# Patient Record
Sex: Female | Born: 1935 | Race: White | Hispanic: No | Smoking: Former smoker
Health system: Southern US, Community
[De-identification: ages and names within clinical notes are randomized; demographics above are authoritative.]

## PROBLEM LIST (undated history)

## (undated) DIAGNOSIS — J189 Pneumonia, unspecified organism: Secondary | ICD-10-CM

## (undated) DIAGNOSIS — D649 Anemia, unspecified: Secondary | ICD-10-CM

## (undated) DIAGNOSIS — J449 Chronic obstructive pulmonary disease, unspecified: Secondary | ICD-10-CM

## (undated) DIAGNOSIS — G20 Parkinson's disease: Secondary | ICD-10-CM

## (undated) DIAGNOSIS — I4891 Unspecified atrial fibrillation: Secondary | ICD-10-CM

## (undated) DIAGNOSIS — J9610 Chronic respiratory failure, unspecified whether with hypoxia or hypercapnia: Secondary | ICD-10-CM

## (undated) DIAGNOSIS — E039 Hypothyroidism, unspecified: Secondary | ICD-10-CM

## (undated) DIAGNOSIS — I509 Heart failure, unspecified: Secondary | ICD-10-CM

## (undated) DIAGNOSIS — C8590 Non-Hodgkin lymphoma, unspecified, unspecified site: Secondary | ICD-10-CM

## (undated) DIAGNOSIS — I38 Endocarditis, valve unspecified: Secondary | ICD-10-CM

## (undated) DIAGNOSIS — M1990 Unspecified osteoarthritis, unspecified site: Secondary | ICD-10-CM

## (undated) DIAGNOSIS — I10 Essential (primary) hypertension: Secondary | ICD-10-CM

## (undated) DIAGNOSIS — J45909 Unspecified asthma, uncomplicated: Secondary | ICD-10-CM

## (undated) DIAGNOSIS — C801 Malignant (primary) neoplasm, unspecified: Secondary | ICD-10-CM

## (undated) HISTORY — DX: Unspecified osteoarthritis, unspecified site: M19.90

## (undated) HISTORY — DX: Chronic respiratory failure, unspecified whether with hypoxia or hypercapnia: J96.10

## (undated) HISTORY — DX: Essential (primary) hypertension: I10

## (undated) HISTORY — PX: ABDOMINAL HYSTERECTOMY: SHX81

## (undated) HISTORY — PX: CHOLECYSTECTOMY: SHX55

## (undated) HISTORY — DX: Parkinson's disease: G20

## (undated) HISTORY — DX: Chronic obstructive pulmonary disease, unspecified: J44.9

## (undated) HISTORY — DX: Non-Hodgkin lymphoma, unspecified, unspecified site: C85.90

## (undated) HISTORY — DX: Unspecified asthma, uncomplicated: J45.909

## (undated) HISTORY — DX: Malignant (primary) neoplasm, unspecified: C80.1

## (undated) HISTORY — DX: Hypothyroidism, unspecified: E03.9

## (undated) HISTORY — DX: Anemia, unspecified: D64.9

## (undated) HISTORY — DX: Unspecified atrial fibrillation: I48.91

## (undated) HISTORY — DX: Pneumonia, unspecified organism: J18.9

## (undated) HISTORY — DX: Endocarditis, valve unspecified: I38

## (undated) HISTORY — DX: Heart failure, unspecified: I50.9

## (undated) HISTORY — PX: MASTECTOMY: SHX3

## (undated) HISTORY — PX: ORIF ANKLE FRACTURE: SUR919

---

## 2000-06-20 ENCOUNTER — Other Ambulatory Visit: Admission: RE | Admit: 2000-06-20 | Discharge: 2000-06-20 | Payer: Self-pay | Admitting: *Deleted

## 2003-11-25 ENCOUNTER — Ambulatory Visit: Payer: Self-pay | Admitting: Oncology

## 2004-02-03 ENCOUNTER — Ambulatory Visit: Payer: Self-pay | Admitting: Oncology

## 2004-04-27 ENCOUNTER — Ambulatory Visit: Payer: Self-pay | Admitting: Oncology

## 2004-06-15 ENCOUNTER — Ambulatory Visit: Payer: Self-pay | Admitting: Oncology

## 2004-08-16 ENCOUNTER — Ambulatory Visit: Payer: Self-pay | Admitting: Oncology

## 2004-09-22 ENCOUNTER — Ambulatory Visit: Payer: Self-pay | Admitting: Oncology

## 2004-11-24 ENCOUNTER — Ambulatory Visit: Payer: Self-pay | Admitting: Oncology

## 2005-02-23 ENCOUNTER — Ambulatory Visit: Payer: Self-pay | Admitting: Oncology

## 2005-05-18 ENCOUNTER — Ambulatory Visit: Payer: Self-pay | Admitting: Oncology

## 2005-09-07 ENCOUNTER — Ambulatory Visit: Payer: Self-pay | Admitting: Oncology

## 2005-12-28 ENCOUNTER — Ambulatory Visit: Payer: Self-pay | Admitting: Oncology

## 2006-04-19 ENCOUNTER — Ambulatory Visit: Payer: Self-pay | Admitting: Oncology

## 2006-08-09 ENCOUNTER — Ambulatory Visit: Payer: Self-pay | Admitting: Oncology

## 2014-11-09 DIAGNOSIS — Z9981 Dependence on supplemental oxygen: Secondary | ICD-10-CM

## 2014-11-09 DIAGNOSIS — Z853 Personal history of malignant neoplasm of breast: Secondary | ICD-10-CM | POA: Diagnosis not present

## 2014-11-09 DIAGNOSIS — Z8572 Personal history of non-Hodgkin lymphomas: Secondary | ICD-10-CM | POA: Diagnosis not present

## 2014-11-09 DIAGNOSIS — E538 Deficiency of other specified B group vitamins: Secondary | ICD-10-CM | POA: Diagnosis not present

## 2014-11-09 DIAGNOSIS — R634 Abnormal weight loss: Secondary | ICD-10-CM | POA: Diagnosis not present

## 2015-05-25 DIAGNOSIS — F339 Major depressive disorder, recurrent, unspecified: Secondary | ICD-10-CM | POA: Insufficient documentation

## 2015-05-25 DIAGNOSIS — I5032 Chronic diastolic (congestive) heart failure: Secondary | ICD-10-CM | POA: Insufficient documentation

## 2015-05-25 DIAGNOSIS — J449 Chronic obstructive pulmonary disease, unspecified: Secondary | ICD-10-CM | POA: Insufficient documentation

## 2015-05-25 DIAGNOSIS — Z853 Personal history of malignant neoplasm of breast: Secondary | ICD-10-CM | POA: Insufficient documentation

## 2015-05-25 DIAGNOSIS — R251 Tremor, unspecified: Secondary | ICD-10-CM | POA: Insufficient documentation

## 2015-05-25 DIAGNOSIS — I10 Essential (primary) hypertension: Secondary | ICD-10-CM | POA: Insufficient documentation

## 2015-05-25 DIAGNOSIS — E039 Hypothyroidism, unspecified: Secondary | ICD-10-CM | POA: Insufficient documentation

## 2015-05-25 DIAGNOSIS — I482 Chronic atrial fibrillation, unspecified: Secondary | ICD-10-CM | POA: Insufficient documentation

## 2015-05-25 DIAGNOSIS — M810 Age-related osteoporosis without current pathological fracture: Secondary | ICD-10-CM | POA: Insufficient documentation

## 2015-05-25 DIAGNOSIS — M159 Polyosteoarthritis, unspecified: Secondary | ICD-10-CM | POA: Insufficient documentation

## 2015-06-09 DIAGNOSIS — Z8572 Personal history of non-Hodgkin lymphomas: Secondary | ICD-10-CM | POA: Diagnosis not present

## 2016-06-07 DIAGNOSIS — Z853 Personal history of malignant neoplasm of breast: Secondary | ICD-10-CM | POA: Diagnosis not present

## 2016-06-07 DIAGNOSIS — J449 Chronic obstructive pulmonary disease, unspecified: Secondary | ICD-10-CM | POA: Diagnosis not present

## 2016-06-07 DIAGNOSIS — Z8572 Personal history of non-Hodgkin lymphomas: Secondary | ICD-10-CM | POA: Diagnosis not present

## 2016-11-23 DIAGNOSIS — Z9221 Personal history of antineoplastic chemotherapy: Secondary | ICD-10-CM | POA: Diagnosis not present

## 2016-11-23 DIAGNOSIS — Z8572 Personal history of non-Hodgkin lymphomas: Secondary | ICD-10-CM | POA: Diagnosis not present

## 2016-11-23 DIAGNOSIS — E538 Deficiency of other specified B group vitamins: Secondary | ICD-10-CM | POA: Diagnosis not present

## 2016-11-23 DIAGNOSIS — Z853 Personal history of malignant neoplasm of breast: Secondary | ICD-10-CM | POA: Diagnosis not present

## 2016-11-23 DIAGNOSIS — J449 Chronic obstructive pulmonary disease, unspecified: Secondary | ICD-10-CM | POA: Diagnosis not present

## 2016-11-23 DIAGNOSIS — Z9981 Dependence on supplemental oxygen: Secondary | ICD-10-CM | POA: Diagnosis not present

## 2016-11-23 DIAGNOSIS — I509 Heart failure, unspecified: Secondary | ICD-10-CM | POA: Diagnosis not present

## 2016-12-14 DIAGNOSIS — R7303 Prediabetes: Secondary | ICD-10-CM | POA: Insufficient documentation

## 2017-07-09 DIAGNOSIS — R609 Edema, unspecified: Secondary | ICD-10-CM | POA: Insufficient documentation

## 2017-11-23 DIAGNOSIS — Z853 Personal history of malignant neoplasm of breast: Secondary | ICD-10-CM | POA: Diagnosis not present

## 2017-11-23 DIAGNOSIS — Z9221 Personal history of antineoplastic chemotherapy: Secondary | ICD-10-CM

## 2017-11-23 DIAGNOSIS — Z9981 Dependence on supplemental oxygen: Secondary | ICD-10-CM

## 2017-11-23 DIAGNOSIS — Z8572 Personal history of non-Hodgkin lymphomas: Secondary | ICD-10-CM | POA: Diagnosis not present

## 2017-11-23 DIAGNOSIS — J449 Chronic obstructive pulmonary disease, unspecified: Secondary | ICD-10-CM | POA: Diagnosis not present

## 2017-11-23 DIAGNOSIS — I509 Heart failure, unspecified: Secondary | ICD-10-CM

## 2017-11-23 DIAGNOSIS — R0789 Other chest pain: Secondary | ICD-10-CM

## 2018-01-06 DIAGNOSIS — J9621 Acute and chronic respiratory failure with hypoxia: Secondary | ICD-10-CM | POA: Diagnosis not present

## 2018-01-06 DIAGNOSIS — I509 Heart failure, unspecified: Secondary | ICD-10-CM | POA: Diagnosis not present

## 2018-01-07 DIAGNOSIS — I509 Heart failure, unspecified: Secondary | ICD-10-CM | POA: Diagnosis not present

## 2018-01-07 DIAGNOSIS — J9621 Acute and chronic respiratory failure with hypoxia: Secondary | ICD-10-CM | POA: Diagnosis not present

## 2018-01-08 DIAGNOSIS — J9621 Acute and chronic respiratory failure with hypoxia: Secondary | ICD-10-CM | POA: Diagnosis not present

## 2018-01-08 DIAGNOSIS — I509 Heart failure, unspecified: Secondary | ICD-10-CM | POA: Diagnosis not present

## 2018-07-30 DIAGNOSIS — Z8572 Personal history of non-Hodgkin lymphomas: Secondary | ICD-10-CM | POA: Insufficient documentation

## 2018-12-22 DIAGNOSIS — J449 Chronic obstructive pulmonary disease, unspecified: Secondary | ICD-10-CM

## 2018-12-22 DIAGNOSIS — J441 Chronic obstructive pulmonary disease with (acute) exacerbation: Secondary | ICD-10-CM

## 2018-12-22 DIAGNOSIS — I340 Nonrheumatic mitral (valve) insufficiency: Secondary | ICD-10-CM | POA: Diagnosis not present

## 2018-12-22 DIAGNOSIS — I4891 Unspecified atrial fibrillation: Secondary | ICD-10-CM

## 2018-12-22 DIAGNOSIS — I342 Nonrheumatic mitral (valve) stenosis: Secondary | ICD-10-CM

## 2018-12-22 DIAGNOSIS — Z79899 Other long term (current) drug therapy: Secondary | ICD-10-CM

## 2018-12-22 DIAGNOSIS — J90 Pleural effusion, not elsewhere classified: Secondary | ICD-10-CM

## 2018-12-22 DIAGNOSIS — I361 Nonrheumatic tricuspid (valve) insufficiency: Secondary | ICD-10-CM | POA: Diagnosis not present

## 2018-12-22 DIAGNOSIS — I509 Heart failure, unspecified: Secondary | ICD-10-CM | POA: Diagnosis not present

## 2018-12-22 DIAGNOSIS — I350 Nonrheumatic aortic (valve) stenosis: Secondary | ICD-10-CM | POA: Diagnosis not present

## 2018-12-22 DIAGNOSIS — I110 Hypertensive heart disease with heart failure: Secondary | ICD-10-CM | POA: Diagnosis not present

## 2018-12-22 DIAGNOSIS — I161 Hypertensive emergency: Secondary | ICD-10-CM

## 2018-12-22 DIAGNOSIS — J9601 Acute respiratory failure with hypoxia: Secondary | ICD-10-CM

## 2018-12-23 DIAGNOSIS — I350 Nonrheumatic aortic (valve) stenosis: Secondary | ICD-10-CM | POA: Diagnosis not present

## 2018-12-23 DIAGNOSIS — I110 Hypertensive heart disease with heart failure: Secondary | ICD-10-CM | POA: Diagnosis not present

## 2018-12-23 DIAGNOSIS — I161 Hypertensive emergency: Secondary | ICD-10-CM | POA: Diagnosis not present

## 2018-12-23 DIAGNOSIS — J90 Pleural effusion, not elsewhere classified: Secondary | ICD-10-CM | POA: Diagnosis not present

## 2018-12-23 DIAGNOSIS — J441 Chronic obstructive pulmonary disease with (acute) exacerbation: Secondary | ICD-10-CM | POA: Diagnosis not present

## 2018-12-23 DIAGNOSIS — J9601 Acute respiratory failure with hypoxia: Secondary | ICD-10-CM | POA: Diagnosis not present

## 2018-12-23 DIAGNOSIS — I4891 Unspecified atrial fibrillation: Secondary | ICD-10-CM | POA: Diagnosis not present

## 2018-12-23 DIAGNOSIS — I509 Heart failure, unspecified: Secondary | ICD-10-CM | POA: Diagnosis not present

## 2018-12-24 DIAGNOSIS — I4891 Unspecified atrial fibrillation: Secondary | ICD-10-CM | POA: Diagnosis not present

## 2018-12-24 DIAGNOSIS — J90 Pleural effusion, not elsewhere classified: Secondary | ICD-10-CM | POA: Diagnosis not present

## 2018-12-24 DIAGNOSIS — I350 Nonrheumatic aortic (valve) stenosis: Secondary | ICD-10-CM | POA: Diagnosis not present

## 2018-12-24 DIAGNOSIS — I110 Hypertensive heart disease with heart failure: Secondary | ICD-10-CM | POA: Diagnosis not present

## 2018-12-24 DIAGNOSIS — J9601 Acute respiratory failure with hypoxia: Secondary | ICD-10-CM | POA: Diagnosis not present

## 2018-12-24 DIAGNOSIS — I509 Heart failure, unspecified: Secondary | ICD-10-CM | POA: Diagnosis not present

## 2018-12-24 DIAGNOSIS — I161 Hypertensive emergency: Secondary | ICD-10-CM | POA: Diagnosis not present

## 2018-12-24 DIAGNOSIS — J441 Chronic obstructive pulmonary disease with (acute) exacerbation: Secondary | ICD-10-CM | POA: Diagnosis not present

## 2018-12-25 DIAGNOSIS — I350 Nonrheumatic aortic (valve) stenosis: Secondary | ICD-10-CM | POA: Diagnosis not present

## 2018-12-25 DIAGNOSIS — I509 Heart failure, unspecified: Secondary | ICD-10-CM | POA: Diagnosis not present

## 2018-12-25 DIAGNOSIS — J90 Pleural effusion, not elsewhere classified: Secondary | ICD-10-CM | POA: Diagnosis not present

## 2018-12-25 DIAGNOSIS — I110 Hypertensive heart disease with heart failure: Secondary | ICD-10-CM | POA: Diagnosis not present

## 2018-12-25 DIAGNOSIS — J441 Chronic obstructive pulmonary disease with (acute) exacerbation: Secondary | ICD-10-CM | POA: Diagnosis not present

## 2018-12-25 DIAGNOSIS — J9601 Acute respiratory failure with hypoxia: Secondary | ICD-10-CM | POA: Diagnosis not present

## 2018-12-25 DIAGNOSIS — I161 Hypertensive emergency: Secondary | ICD-10-CM | POA: Diagnosis not present

## 2018-12-25 DIAGNOSIS — I4891 Unspecified atrial fibrillation: Secondary | ICD-10-CM | POA: Diagnosis not present

## 2018-12-26 DIAGNOSIS — J9601 Acute respiratory failure with hypoxia: Secondary | ICD-10-CM | POA: Diagnosis not present

## 2018-12-26 DIAGNOSIS — J441 Chronic obstructive pulmonary disease with (acute) exacerbation: Secondary | ICD-10-CM | POA: Diagnosis not present

## 2018-12-26 DIAGNOSIS — I350 Nonrheumatic aortic (valve) stenosis: Secondary | ICD-10-CM | POA: Diagnosis not present

## 2018-12-26 DIAGNOSIS — I110 Hypertensive heart disease with heart failure: Secondary | ICD-10-CM | POA: Diagnosis not present

## 2018-12-26 DIAGNOSIS — I161 Hypertensive emergency: Secondary | ICD-10-CM | POA: Diagnosis not present

## 2018-12-26 DIAGNOSIS — I4891 Unspecified atrial fibrillation: Secondary | ICD-10-CM | POA: Diagnosis not present

## 2018-12-26 DIAGNOSIS — I509 Heart failure, unspecified: Secondary | ICD-10-CM | POA: Diagnosis not present

## 2018-12-26 DIAGNOSIS — J90 Pleural effusion, not elsewhere classified: Secondary | ICD-10-CM | POA: Diagnosis not present

## 2018-12-27 DIAGNOSIS — J441 Chronic obstructive pulmonary disease with (acute) exacerbation: Secondary | ICD-10-CM | POA: Diagnosis not present

## 2018-12-27 DIAGNOSIS — I509 Heart failure, unspecified: Secondary | ICD-10-CM | POA: Diagnosis not present

## 2018-12-27 DIAGNOSIS — I110 Hypertensive heart disease with heart failure: Secondary | ICD-10-CM | POA: Diagnosis not present

## 2018-12-27 DIAGNOSIS — I161 Hypertensive emergency: Secondary | ICD-10-CM | POA: Diagnosis not present

## 2018-12-27 DIAGNOSIS — I4891 Unspecified atrial fibrillation: Secondary | ICD-10-CM | POA: Diagnosis not present

## 2018-12-27 DIAGNOSIS — J90 Pleural effusion, not elsewhere classified: Secondary | ICD-10-CM | POA: Diagnosis not present

## 2018-12-27 DIAGNOSIS — J9601 Acute respiratory failure with hypoxia: Secondary | ICD-10-CM | POA: Diagnosis not present

## 2018-12-27 DIAGNOSIS — I350 Nonrheumatic aortic (valve) stenosis: Secondary | ICD-10-CM | POA: Diagnosis not present

## 2018-12-28 DIAGNOSIS — I509 Heart failure, unspecified: Secondary | ICD-10-CM | POA: Diagnosis not present

## 2018-12-28 DIAGNOSIS — I350 Nonrheumatic aortic (valve) stenosis: Secondary | ICD-10-CM

## 2018-12-28 DIAGNOSIS — J441 Chronic obstructive pulmonary disease with (acute) exacerbation: Secondary | ICD-10-CM | POA: Diagnosis not present

## 2018-12-28 DIAGNOSIS — I4891 Unspecified atrial fibrillation: Secondary | ICD-10-CM | POA: Diagnosis not present

## 2018-12-28 DIAGNOSIS — I161 Hypertensive emergency: Secondary | ICD-10-CM | POA: Diagnosis not present

## 2018-12-28 DIAGNOSIS — J9601 Acute respiratory failure with hypoxia: Secondary | ICD-10-CM | POA: Diagnosis not present

## 2018-12-28 DIAGNOSIS — J90 Pleural effusion, not elsewhere classified: Secondary | ICD-10-CM | POA: Diagnosis not present

## 2018-12-28 DIAGNOSIS — I119 Hypertensive heart disease without heart failure: Secondary | ICD-10-CM

## 2018-12-29 DIAGNOSIS — I350 Nonrheumatic aortic (valve) stenosis: Secondary | ICD-10-CM | POA: Diagnosis not present

## 2018-12-29 DIAGNOSIS — J90 Pleural effusion, not elsewhere classified: Secondary | ICD-10-CM | POA: Diagnosis not present

## 2018-12-29 DIAGNOSIS — I119 Hypertensive heart disease without heart failure: Secondary | ICD-10-CM | POA: Diagnosis not present

## 2018-12-29 DIAGNOSIS — I161 Hypertensive emergency: Secondary | ICD-10-CM | POA: Diagnosis not present

## 2018-12-29 DIAGNOSIS — I509 Heart failure, unspecified: Secondary | ICD-10-CM | POA: Diagnosis not present

## 2018-12-29 DIAGNOSIS — J9601 Acute respiratory failure with hypoxia: Secondary | ICD-10-CM | POA: Diagnosis not present

## 2018-12-29 DIAGNOSIS — I4891 Unspecified atrial fibrillation: Secondary | ICD-10-CM | POA: Diagnosis not present

## 2018-12-29 DIAGNOSIS — J441 Chronic obstructive pulmonary disease with (acute) exacerbation: Secondary | ICD-10-CM | POA: Diagnosis not present

## 2018-12-30 DIAGNOSIS — I161 Hypertensive emergency: Secondary | ICD-10-CM | POA: Diagnosis not present

## 2018-12-30 DIAGNOSIS — I119 Hypertensive heart disease without heart failure: Secondary | ICD-10-CM | POA: Diagnosis not present

## 2018-12-30 DIAGNOSIS — I350 Nonrheumatic aortic (valve) stenosis: Secondary | ICD-10-CM | POA: Diagnosis not present

## 2018-12-30 DIAGNOSIS — I4891 Unspecified atrial fibrillation: Secondary | ICD-10-CM | POA: Diagnosis not present

## 2018-12-30 DIAGNOSIS — J90 Pleural effusion, not elsewhere classified: Secondary | ICD-10-CM | POA: Diagnosis not present

## 2018-12-30 DIAGNOSIS — I509 Heart failure, unspecified: Secondary | ICD-10-CM | POA: Diagnosis not present

## 2018-12-30 DIAGNOSIS — J441 Chronic obstructive pulmonary disease with (acute) exacerbation: Secondary | ICD-10-CM | POA: Diagnosis not present

## 2018-12-30 DIAGNOSIS — J9601 Acute respiratory failure with hypoxia: Secondary | ICD-10-CM | POA: Diagnosis not present

## 2018-12-31 DIAGNOSIS — J441 Chronic obstructive pulmonary disease with (acute) exacerbation: Secondary | ICD-10-CM | POA: Diagnosis not present

## 2018-12-31 DIAGNOSIS — I509 Heart failure, unspecified: Secondary | ICD-10-CM | POA: Diagnosis not present

## 2018-12-31 DIAGNOSIS — I350 Nonrheumatic aortic (valve) stenosis: Secondary | ICD-10-CM | POA: Diagnosis not present

## 2018-12-31 DIAGNOSIS — I119 Hypertensive heart disease without heart failure: Secondary | ICD-10-CM | POA: Diagnosis not present

## 2018-12-31 DIAGNOSIS — I4891 Unspecified atrial fibrillation: Secondary | ICD-10-CM | POA: Diagnosis not present

## 2018-12-31 DIAGNOSIS — J9601 Acute respiratory failure with hypoxia: Secondary | ICD-10-CM | POA: Diagnosis not present

## 2018-12-31 DIAGNOSIS — J90 Pleural effusion, not elsewhere classified: Secondary | ICD-10-CM | POA: Diagnosis not present

## 2018-12-31 DIAGNOSIS — I161 Hypertensive emergency: Secondary | ICD-10-CM | POA: Diagnosis not present

## 2019-01-01 DIAGNOSIS — J90 Pleural effusion, not elsewhere classified: Secondary | ICD-10-CM | POA: Diagnosis not present

## 2019-01-01 DIAGNOSIS — I4891 Unspecified atrial fibrillation: Secondary | ICD-10-CM | POA: Diagnosis not present

## 2019-01-01 DIAGNOSIS — I509 Heart failure, unspecified: Secondary | ICD-10-CM | POA: Diagnosis not present

## 2019-01-01 DIAGNOSIS — J9601 Acute respiratory failure with hypoxia: Secondary | ICD-10-CM | POA: Diagnosis not present

## 2019-01-01 DIAGNOSIS — I161 Hypertensive emergency: Secondary | ICD-10-CM | POA: Diagnosis not present

## 2019-01-01 DIAGNOSIS — I350 Nonrheumatic aortic (valve) stenosis: Secondary | ICD-10-CM | POA: Diagnosis not present

## 2019-01-01 DIAGNOSIS — I119 Hypertensive heart disease without heart failure: Secondary | ICD-10-CM | POA: Diagnosis not present

## 2019-01-01 DIAGNOSIS — J441 Chronic obstructive pulmonary disease with (acute) exacerbation: Secondary | ICD-10-CM | POA: Diagnosis not present

## 2019-01-02 ENCOUNTER — Other Ambulatory Visit: Payer: Self-pay | Admitting: *Deleted

## 2019-01-02 DIAGNOSIS — J9601 Acute respiratory failure with hypoxia: Secondary | ICD-10-CM | POA: Diagnosis not present

## 2019-01-02 DIAGNOSIS — J441 Chronic obstructive pulmonary disease with (acute) exacerbation: Secondary | ICD-10-CM | POA: Diagnosis not present

## 2019-01-02 DIAGNOSIS — I350 Nonrheumatic aortic (valve) stenosis: Secondary | ICD-10-CM | POA: Diagnosis not present

## 2019-01-02 DIAGNOSIS — I509 Heart failure, unspecified: Secondary | ICD-10-CM | POA: Diagnosis not present

## 2019-01-02 DIAGNOSIS — J90 Pleural effusion, not elsewhere classified: Secondary | ICD-10-CM | POA: Diagnosis not present

## 2019-01-02 DIAGNOSIS — I10 Essential (primary) hypertension: Secondary | ICD-10-CM

## 2019-01-02 DIAGNOSIS — I119 Hypertensive heart disease without heart failure: Secondary | ICD-10-CM | POA: Diagnosis not present

## 2019-01-02 DIAGNOSIS — I161 Hypertensive emergency: Secondary | ICD-10-CM | POA: Diagnosis not present

## 2019-01-02 DIAGNOSIS — I4891 Unspecified atrial fibrillation: Secondary | ICD-10-CM | POA: Diagnosis not present

## 2019-01-16 ENCOUNTER — Other Ambulatory Visit: Payer: Self-pay

## 2019-01-16 ENCOUNTER — Encounter: Payer: Self-pay | Admitting: Cardiology

## 2019-01-16 ENCOUNTER — Telehealth (INDEPENDENT_AMBULATORY_CARE_PROVIDER_SITE_OTHER): Payer: Medicare Other | Admitting: Cardiology

## 2019-01-16 VITALS — BP 111/81 | HR 68 | Ht 63.0 in | Wt 115.5 lb

## 2019-01-16 DIAGNOSIS — I482 Chronic atrial fibrillation, unspecified: Secondary | ICD-10-CM

## 2019-01-16 DIAGNOSIS — R079 Chest pain, unspecified: Secondary | ICD-10-CM | POA: Diagnosis not present

## 2019-01-16 DIAGNOSIS — I10 Essential (primary) hypertension: Secondary | ICD-10-CM

## 2019-01-16 DIAGNOSIS — I5032 Chronic diastolic (congestive) heart failure: Secondary | ICD-10-CM | POA: Insufficient documentation

## 2019-01-16 DIAGNOSIS — I5030 Unspecified diastolic (congestive) heart failure: Secondary | ICD-10-CM | POA: Diagnosis not present

## 2019-01-16 DIAGNOSIS — I110 Hypertensive heart disease with heart failure: Secondary | ICD-10-CM | POA: Diagnosis not present

## 2019-01-16 DIAGNOSIS — I4821 Permanent atrial fibrillation: Secondary | ICD-10-CM | POA: Insufficient documentation

## 2019-01-16 NOTE — Progress Notes (Signed)
Started as a telehealth visit converted to telephone due to patient not being able to hear me clearly  Date:  01/16/2019   ID:  Kaylee Glover, DOB 09/11/1932, MRN [id number]  The patient is at home/at a nursing facility.  I am in the office    PCP:  Raina Mina., MD  Cardiologist:  Berniece Salines, DO Electrophysiologist:  None   Evaluation Performed:  Initial visit for establishment of care  Chief Complaint: Establishment of care and chest pain  History of Present Illness:    Kaylee Glover is a 85 y.o. female with atrial fibrillation (patient reports history of GI bleeding on anticoagulation and prefers not to take any anticoagulation) on digoxin and Cardizem, heart failure with preserved ejection fraction, mild aortic stenosis, mild mitral stenosis.  COPD on home oxygen.  The patient was recently treated at the [facility] for acute on chronic heart failure with preserved ejection fraction.  She was then discharged to the facility for physical therapy.  The patient has since felt pneumonia.  Today she tells me we are telehealth visit that she had been experiencing intermittent chest pain.  She described as a substernal pain that sometimes is left-sided without radiation.  It feels more as a dull sensation she denies any associated shortness of breath lightheadedness or dizziness.  The patient does have symptoms concerning for COVID-19 infection, symptoms include coughing and is being treated for pneumonia.  Past Medical History:  Diagnosis Date  . Anemia   . Asthma   . Atrial fibrillation ([place])   . Cancer ([place])   . Chronic respiratory failure ([place])   . Congestive heart failure (CHF) ([place])   . COPD (chronic obstructive pulmonary disease) ([place])   . Hypertension   . Hypothyroidism   . Non-Hodgkin lymphoma ([place])   . Osteoarthritis   . Parkinson disease ([place])   . Pneumonia   . Valvular heart disease    Past Surgical History:  Procedure Laterality Date  . ABDOMINAL  HYSTERECTOMY    . CHOLECYSTECTOMY    . MASTECTOMY Right   . ORIF ANKLE FRACTURE Left      Current Meds  Medication Sig  . acetaminophen (TYLENOL) 500 MG tablet Take by mouth.  Marland Kitchen albuterol (VENTOLIN HFA) 108 (90 Base) MCG/ACT inhaler INHALE 2 PUFFS BY MOUTH EVERY 4 HOURS AS NEEDED FOR WHEEZE  . ascorbic acid (VITAMIN C) 500 MG tablet Take 500 mg by mouth daily.  . calcium carbonate (OS-CAL) 1250 (500 Ca) MG chewable tablet Chew 1 tablet by mouth daily.  . cefUROXime (CEFTIN) 250 MG tablet Take 500 mg by mouth 2 (two) times daily with a meal.  . cetirizine (ZYRTEC) 10 MG tablet Take by mouth.  . cholecalciferol (VITAMIN D3) 25 MCG (1000 UT) tablet Take 1,000 Units by mouth daily.  . digoxin (LANOXIN) 0.125 MG tablet Take 125 mcg by mouth daily.  Marland Kitchen diltiazem (CARDIZEM) 60 MG tablet 60 mg daily.  Marland Kitchen docusate sodium (COLACE) 100 MG capsule Take 100 mg by mouth 2 (two) times daily.  . fluticasone (FLONASE) 50 MCG/ACT nasal spray 1 spray by Each Nare route daily.  . fluticasone furoate-vilanterol (BREO ELLIPTA) 100-25 MCG/INH AEPB Inhale 1 puff into the lungs daily.  . furosemide (LASIX) 40 MG tablet Take by mouth.  . hydrALAZINE (APRESOLINE) 50 MG tablet Take 50 mg by mouth 3 (three) times daily.  . isosorbide dinitrate (ISORDIL) 20 MG tablet Take 20 mg by mouth 3 (three) times daily.  Marland Kitchen levothyroxine (SYNTHROID)  100 MCG tablet Take 100 mcg by mouth daily before breakfast.  . polyethylene glycol (MIRALAX / GLYCOLAX) 17 g packet Take 17 g by mouth daily.  . sennosides-docusate sodium (SENOKOT-S) 8.6-50 MG tablet Take 1 tablet by mouth as needed for constipation.  . sertraline (ZOLOFT) 25 MG tablet TAKE 1 TABLET BY MOUTH EVERY DAY  . tiotropium (SPIRIVA HANDIHALER) 18 MCG inhalation capsule INHALE 1 CAPSULE VIA HANDIHALER ONCE DAILY AT THE SAME TIME EVERY DAY  . Zinc 50 MG CAPS Take 50 mg by mouth daily.     Allergies:   Patient has no allergy information on record.   Social History    Tobacco Use  . Smoking status: Former Research scientist (life sciences)  . Smokeless tobacco: Never Used  Substance Use Topics  . Alcohol use: Never  . Drug use: Never     Family Hx: The patient's family history includes Breast cancer in her mother and sister; CAD in her sister; Diabetes in her brother; Hypertension in her brother.  ROS:   Review of Systems  Constitution: Negative for decreased appetite, fever and weight gain.  HENT: Negative for congestion, ear discharge, hoarse voice and sore throat.   Eyes: Negative for discharge, redness, vision loss in right eye and visual halos.  Cardiovascular: Negative for chest pain, dyspnea on exertion, leg swelling, orthopnea and palpitations.  Respiratory: Negative for cough, hemoptysis, shortness of breath and snoring.   Endocrine: Negative for heat intolerance and polyphagia.  Hematologic/Lymphatic: Negative for bleeding problem. Does not bruise/bleed easily.  Skin: Negative for flushing, nail changes, rash and suspicious lesions.  Musculoskeletal: Negative for arthritis, joint pain, muscle cramps, myalgias, neck pain and stiffness.  Gastrointestinal: Negative for abdominal pain, bowel incontinence, diarrhea and excessive appetite.  Genitourinary: Negative for decreased libido, genital sores and incomplete emptying.  Neurological: Negative for brief paralysis, focal weakness, headaches and loss of balance.  Psychiatric/Behavioral: Negative for altered mental status, depression and suicidal ideas.  Allergic/Immunologic: Negative for HIV exposure and persistent infections.   Prior CV studies:    The following studies were reviewed today:  Echocardiogram done at [facility] on December 22, 2018.  Normal left ventricular contractility.  Left ventricular systolic function is normal.  60 to 65%.  Left atrium severely dilated.  Right atrium is severely enlarged by volume.  Aortic valve is thickened and mildly calcified. Mild aortic stenosis: Mean gradient  millimeters mercury, aortic valve area by continuity equation 1.6 cm.  Severe mitral annular calcification present.  Moderate mitral reg.  Mild to moderate non-rheumatic mitral stenosis.   Labs/Other Tests and Data Reviewed:    EKG:  None performed today however the EKG done on 01/01/2019 at [facility] showed atrial fibrillation heart rate 72 bpm with intermittent PVCs.  Recent Labs: No results found for requested labs within last 8760 hours.   Recent Lipid Panel No results found for: CHOL, TRIG, HDL, CHOLHDL, LDLCALC, LDLDIRECT  Wt Readings from Last 3 Encounters:  01/16/19 115 lb 8 oz (52.4 kg)     Objective:    Vital Signs:  BP 111/81   Pulse 68   Ht 5\' 3"  (1.6 m)   Wt 115 lb 8 oz (52.4 kg)   BMI 20.46 kg/m    Unable to perform for physical exam.  ASSESSMENT & PLAN:    1.  In terms of her chest pain she is on Isordil 20 mg every 8 hours.  Continue this for now and give her sublingual nitro as well.  We will reassess his pain  at her next visit.  Right now with her lung pathology and a pneumonia is not a good idea to perform a Lexiscan which will be the better test for this patient.  Therefore we will continue to treat her medically medically for now.  2.  In terms of her heart failure based on the assessment of her nurse at the nursing facility it appears that she continues to be euvolemic on her daily dose of Lasix.  We will get blood work which will include chemistry, BMP and mag.  3.  For chronic atrial fibrillation she has been rate controlled on diltiazem and digoxin.  She has declined many times in the hospital with me the use of anticoagulation due to history of GI bleeding.  4.  Her blood pressure reported is acceptable to continue her current regimen.  Of note the prescription for both her nitroglycerin and her lab testing was sent to the nursing facility.  She will follow up in in 1 month to reassess her chest pain.   COVID-19 Education: The signs and  symptoms of COVID-19 were discussed with the patient and how to seek care for testing (follow up with PCP or arrange E-visit).  The importance of social distancing was discussed today.  Time:   Today, I have spent 12 minutes with the patient with telehealth technology discussing the above problems.     Medication Adjustments/Labs and Tests Ordered: Current medicines are reviewed at length with the patient today.  Concerns regarding medicines are outlined above.   Tests Ordered: Orders Placed This Encounter  Procedures  . Basic Metabolic Panel (BMET)  . Magnesium    Medication Changes: No orders of the defined types were placed in this encounter.   Follow Up: 1 month  Signed, Berniece Salines, DO  01/16/2019 12:11 PM    [facility]

## 2019-01-16 NOTE — Patient Instructions (Addendum)
Medication Instructions:  Your physician has recommended you make the following change in your medication:  Nitroglycerin 0.4 mg sublingual (under your tongue) as needed for chest pain. If experiencing chest pain, stop what you are doing and sit down. Take 1 nitroglycerin and wait 5 minutes. If chest pain continues, take another nitroglycerin and wait 5 minutes. If chest pain does not subside, take 1 more nitroglycerin and dial 911. You make take a total of 3 nitroglycerin in a 15 minute time frame.   *If you need a refill on your cardiac medications before your next appointment, please call your pharmacy*  Lab Work: Your physician recommends that you return for lab work in:   NEXT WEEK: BMP,Magnesium at facility  If you have labs (blood work) drawn today and your tests are completely normal, you will receive your results only by: Marland Kitchen MyChart Message (if you have MyChart) OR . A paper copy in the mail If you have any lab test that is abnormal or we need to change your treatment, we will call you to review the results.  Testing/Procedures: None  Follow-Up: At [facility], you and your health needs are our priority.  As part of our continuing mission to provide you with exceptional heart care, we have created designated Provider Care Teams.  These Care Teams include your primary Cardiologist (physician) and Advanced Practice Providers (APPs -  Physician Assistants and Nurse Practitioners) who all work together to provide you with the care you need, when you need it.  Your next appointment:   1 month(s)  The format for your next appointment:   In Person  Provider:   Berniece Salines, DO  Other Instructions

## 2019-01-17 ENCOUNTER — Inpatient Hospital Stay
Admission: AD | Admit: 2019-01-17 | Payer: Medicare Other | Source: Other Acute Inpatient Hospital | Admitting: Internal Medicine

## 2019-01-17 NOTE — Progress Notes (Signed)
Called [place] [facility], notified that positive Covid result needs to be faxed to @ [phone] prior to pt being admitted to [facility]. Call [phone] with questions.

## 2019-01-18 ENCOUNTER — Inpatient Hospital Stay (HOSPITAL_COMMUNITY)
Admission: AD | Admit: 2019-01-18 | Discharge: 2019-02-17 | DRG: 208 | Disposition: E | Payer: Medicare Other | Source: Other Acute Inpatient Hospital | Attending: Pulmonary Disease | Admitting: Pulmonary Disease

## 2019-01-18 ENCOUNTER — Encounter (HOSPITAL_COMMUNITY): Payer: Self-pay | Admitting: Emergency Medicine

## 2019-01-18 ENCOUNTER — Other Ambulatory Visit: Payer: Self-pay

## 2019-01-18 ENCOUNTER — Inpatient Hospital Stay (HOSPITAL_COMMUNITY): Payer: Medicare Other

## 2019-01-18 DIAGNOSIS — J9622 Acute and chronic respiratory failure with hypercapnia: Secondary | ICD-10-CM | POA: Diagnosis present

## 2019-01-18 DIAGNOSIS — J9602 Acute respiratory failure with hypercapnia: Secondary | ICD-10-CM | POA: Diagnosis not present

## 2019-01-18 DIAGNOSIS — I4811 Longstanding persistent atrial fibrillation: Secondary | ICD-10-CM

## 2019-01-18 DIAGNOSIS — Z8572 Personal history of non-Hodgkin lymphomas: Secondary | ICD-10-CM | POA: Diagnosis not present

## 2019-01-18 DIAGNOSIS — Z515 Encounter for palliative care: Secondary | ICD-10-CM | POA: Diagnosis not present

## 2019-01-18 DIAGNOSIS — I5031 Acute diastolic (congestive) heart failure: Secondary | ICD-10-CM

## 2019-01-18 DIAGNOSIS — G92 Toxic encephalopathy: Secondary | ICD-10-CM | POA: Diagnosis present

## 2019-01-18 DIAGNOSIS — I110 Hypertensive heart disease with heart failure: Secondary | ICD-10-CM | POA: Diagnosis present

## 2019-01-18 DIAGNOSIS — Z9981 Dependence on supplemental oxygen: Secondary | ICD-10-CM | POA: Diagnosis not present

## 2019-01-18 DIAGNOSIS — J9621 Acute and chronic respiratory failure with hypoxia: Secondary | ICD-10-CM | POA: Diagnosis present

## 2019-01-18 DIAGNOSIS — R739 Hyperglycemia, unspecified: Secondary | ICD-10-CM | POA: Diagnosis not present

## 2019-01-18 DIAGNOSIS — J1282 Pneumonia due to coronavirus disease 2019: Secondary | ICD-10-CM | POA: Diagnosis present

## 2019-01-18 DIAGNOSIS — Z7951 Long term (current) use of inhaled steroids: Secondary | ICD-10-CM

## 2019-01-18 DIAGNOSIS — Z803 Family history of malignant neoplasm of breast: Secondary | ICD-10-CM

## 2019-01-18 DIAGNOSIS — I080 Rheumatic disorders of both mitral and aortic valves: Secondary | ICD-10-CM | POA: Diagnosis present

## 2019-01-18 DIAGNOSIS — J90 Pleural effusion, not elsewhere classified: Secondary | ICD-10-CM

## 2019-01-18 DIAGNOSIS — R001 Bradycardia, unspecified: Secondary | ICD-10-CM | POA: Diagnosis not present

## 2019-01-18 DIAGNOSIS — I482 Chronic atrial fibrillation, unspecified: Secondary | ICD-10-CM

## 2019-01-18 DIAGNOSIS — U071 COVID-19: Principal | ICD-10-CM

## 2019-01-18 DIAGNOSIS — R0602 Shortness of breath: Secondary | ICD-10-CM

## 2019-01-18 DIAGNOSIS — D696 Thrombocytopenia, unspecified: Secondary | ICD-10-CM | POA: Diagnosis not present

## 2019-01-18 DIAGNOSIS — N179 Acute kidney failure, unspecified: Secondary | ICD-10-CM | POA: Diagnosis present

## 2019-01-18 DIAGNOSIS — J449 Chronic obstructive pulmonary disease, unspecified: Secondary | ICD-10-CM

## 2019-01-18 DIAGNOSIS — Z8249 Family history of ischemic heart disease and other diseases of the circulatory system: Secondary | ICD-10-CM

## 2019-01-18 DIAGNOSIS — D649 Anemia, unspecified: Secondary | ICD-10-CM | POA: Diagnosis not present

## 2019-01-18 DIAGNOSIS — J9601 Acute respiratory failure with hypoxia: Secondary | ICD-10-CM

## 2019-01-18 DIAGNOSIS — Z0189 Encounter for other specified special examinations: Secondary | ICD-10-CM

## 2019-01-18 DIAGNOSIS — E039 Hypothyroidism, unspecified: Secondary | ICD-10-CM | POA: Diagnosis present

## 2019-01-18 DIAGNOSIS — E46 Unspecified protein-calorie malnutrition: Secondary | ICD-10-CM | POA: Diagnosis present

## 2019-01-18 DIAGNOSIS — Z79890 Hormone replacement therapy (postmenopausal): Secondary | ICD-10-CM

## 2019-01-18 DIAGNOSIS — Z66 Do not resuscitate: Secondary | ICD-10-CM | POA: Diagnosis not present

## 2019-01-18 DIAGNOSIS — R627 Adult failure to thrive: Secondary | ICD-10-CM | POA: Diagnosis present

## 2019-01-18 DIAGNOSIS — I5033 Acute on chronic diastolic (congestive) heart failure: Secondary | ICD-10-CM | POA: Diagnosis not present

## 2019-01-18 DIAGNOSIS — Z79899 Other long term (current) drug therapy: Secondary | ICD-10-CM

## 2019-01-18 DIAGNOSIS — E872 Acidosis: Secondary | ICD-10-CM | POA: Diagnosis present

## 2019-01-18 DIAGNOSIS — Z87891 Personal history of nicotine dependence: Secondary | ICD-10-CM

## 2019-01-18 DIAGNOSIS — Z789 Other specified health status: Secondary | ICD-10-CM

## 2019-01-18 DIAGNOSIS — J440 Chronic obstructive pulmonary disease with acute lower respiratory infection: Secondary | ICD-10-CM | POA: Diagnosis not present

## 2019-01-18 DIAGNOSIS — Z833 Family history of diabetes mellitus: Secondary | ICD-10-CM

## 2019-01-18 DIAGNOSIS — Z95828 Presence of other vascular implants and grafts: Secondary | ICD-10-CM

## 2019-01-18 DIAGNOSIS — L8990 Pressure ulcer of unspecified site, unspecified stage: Secondary | ICD-10-CM | POA: Insufficient documentation

## 2019-01-18 DIAGNOSIS — J9600 Acute respiratory failure, unspecified whether with hypoxia or hypercapnia: Secondary | ICD-10-CM

## 2019-01-18 LAB — CBC WITH DIFFERENTIAL/PLATELET
Abs Immature Granulocytes: 0.05 10*3/uL (ref 0.00–0.07)
Basophils Absolute: 0 10*3/uL (ref 0.0–0.1)
Basophils Relative: 0 %
Eosinophils Absolute: 0 10*3/uL (ref 0.0–0.5)
Eosinophils Relative: 0 %
HCT: 22.4 % — ABNORMAL LOW (ref 36.0–46.0)
Hemoglobin: 6.9 g/dL — CL (ref 12.0–15.0)
Immature Granulocytes: 1 %
Lymphocytes Relative: 7 %
Lymphs Abs: 0.7 10*3/uL (ref 0.7–4.0)
MCH: 30.7 pg (ref 26.0–34.0)
MCHC: 30.8 g/dL (ref 30.0–36.0)
MCV: 99.6 fL (ref 80.0–100.0)
Monocytes Absolute: 0.3 10*3/uL (ref 0.1–1.0)
Monocytes Relative: 3 %
Neutro Abs: 7.9 10*3/uL — ABNORMAL HIGH (ref 1.7–7.7)
Neutrophils Relative %: 89 %
Platelets: 126 10*3/uL — ABNORMAL LOW (ref 150–400)
RBC: 2.25 MIL/uL — ABNORMAL LOW (ref 3.87–5.11)
RDW: 14.3 % (ref 11.5–15.5)
WBC: 8.9 10*3/uL (ref 4.0–10.5)
nRBC: 0 % (ref 0.0–0.2)

## 2019-01-18 LAB — PROCALCITONIN: Procalcitonin: 0.59 ng/mL

## 2019-01-18 LAB — COMPREHENSIVE METABOLIC PANEL
ALT: 35 U/L (ref 0–44)
AST: 29 U/L (ref 15–41)
Albumin: 2.8 g/dL — ABNORMAL LOW (ref 3.5–5.0)
Alkaline Phosphatase: 61 U/L (ref 38–126)
Anion gap: 14 (ref 5–15)
BUN: 54 mg/dL — ABNORMAL HIGH (ref 8–23)
CO2: 31 mmol/L (ref 22–32)
Calcium: 8.3 mg/dL — ABNORMAL LOW (ref 8.9–10.3)
Chloride: 100 mmol/L (ref 98–111)
Creatinine, Ser: 1.04 mg/dL — ABNORMAL HIGH (ref 0.44–1.00)
GFR calc Af Amer: 57 mL/min — ABNORMAL LOW (ref >60)
GFR calc non Af Amer: 49 mL/min — ABNORMAL LOW (ref >60)
Glucose, Bld: 95 mg/dL (ref 70–99)
Potassium: 4.3 mmol/L (ref 3.5–5.1)
Sodium: 145 mmol/L (ref 135–145)
Total Bilirubin: 1.1 mg/dL (ref 0.3–1.2)
Total Protein: 4.9 g/dL — ABNORMAL LOW (ref 6.5–8.1)

## 2019-01-18 LAB — ABO/RH: ABO/RH(D): A NEG

## 2019-01-18 LAB — PREPARE RBC (CROSSMATCH)

## 2019-01-18 LAB — TROPONIN I (HIGH SENSITIVITY)
Troponin I (High Sensitivity): 74 ng/L — ABNORMAL HIGH (ref <18)
Troponin I (High Sensitivity): 76 ng/L — ABNORMAL HIGH (ref <18)

## 2019-01-18 LAB — LACTATE DEHYDROGENASE: LDH: 154 U/L (ref 98–192)

## 2019-01-18 LAB — GLUCOSE, CAPILLARY
Glucose-Capillary: 83 mg/dL (ref 70–99)
Glucose-Capillary: 84 mg/dL (ref 70–99)
Glucose-Capillary: 87 mg/dL (ref 70–99)
Glucose-Capillary: 98 mg/dL (ref 70–99)

## 2019-01-18 LAB — MRSA PCR SCREENING: MRSA by PCR: NEGATIVE

## 2019-01-18 MED ORDER — SODIUM CHLORIDE 0.9 % IV SOLN: INTRAVENOUS | Status: DC

## 2019-01-18 MED ORDER — FAMOTIDINE IN NACL 20-0.9 MG/50ML-% IV SOLN
20.0000 mg | INTRAVENOUS | Status: DC
  Administered 2019-01-19: 20 mg via INTRAVENOUS
  Filled 2019-01-18: qty 50

## 2019-01-18 MED ORDER — INSULIN ASPART 100 UNIT/ML ~~LOC~~ SOLN
0.0000 [IU] | SUBCUTANEOUS | Status: DC
  Administered 2019-01-20 – 2019-01-21 (×2): 1 [IU] via SUBCUTANEOUS
  Administered 2019-01-21 (×2): 2 [IU] via SUBCUTANEOUS
  Administered 2019-01-21 (×3): 1 [IU] via SUBCUTANEOUS
  Administered 2019-01-22: 3 [IU] via SUBCUTANEOUS
  Administered 2019-01-22: 2 [IU] via SUBCUTANEOUS
  Administered 2019-01-22 (×2): 1 [IU] via SUBCUTANEOUS
  Administered 2019-01-23 (×2): 2 [IU] via SUBCUTANEOUS

## 2019-01-18 MED ORDER — ASCORBIC ACID 500 MG PO TABS
500.0000 mg | ORAL_TABLET | Freq: Every day | ORAL | Status: DC
  Administered 2019-01-18 – 2019-01-23 (×6): 500 mg via ORAL
  Filled 2019-01-18 (×6): qty 1

## 2019-01-18 MED ORDER — ENOXAPARIN SODIUM 30 MG/0.3ML ~~LOC~~ SOLN
30.0000 mg | Freq: Two times a day (BID) | SUBCUTANEOUS | Status: DC
  Administered 2019-01-18 – 2019-01-19 (×2): 30 mg via SUBCUTANEOUS
  Filled 2019-01-18 (×2): qty 0.3

## 2019-01-18 MED ORDER — SODIUM CHLORIDE 0.9 % IV SOLN: 100.0000 mg | Freq: Every day | INTRAVENOUS | Status: DC

## 2019-01-18 MED ORDER — LEVOTHYROXINE SODIUM 100 MCG/5ML IV SOLN
50.0000 ug | Freq: Every day | INTRAVENOUS | Status: DC
  Administered 2019-01-18 – 2019-01-20 (×3): 50 ug via INTRAVENOUS
  Filled 2019-01-18 (×3): qty 5

## 2019-01-18 MED ORDER — IPRATROPIUM-ALBUTEROL 0.5-2.5 (3) MG/3ML IN SOLN
3.0000 mL | Freq: Four times a day (QID) | RESPIRATORY_TRACT | Status: DC
  Administered 2019-01-18 – 2019-01-19 (×3): 3 mL via RESPIRATORY_TRACT
  Filled 2019-01-18 (×3): qty 3

## 2019-01-18 MED ORDER — FENTANYL CITRATE (PF) 100 MCG/2ML IJ SOLN
25.0000 ug | INTRAMUSCULAR | Status: DC | PRN
  Administered 2019-01-20 (×2): 25 ug via INTRAVENOUS
  Filled 2019-01-18 (×2): qty 2

## 2019-01-18 MED ORDER — SODIUM CHLORIDE 0.9 % IV SOLN: 200.0000 mg | Freq: Once | INTRAVENOUS | Status: DC

## 2019-01-18 MED ORDER — SODIUM CHLORIDE 0.9% IV SOLUTION: Freq: Once | INTRAVENOUS | Status: DC

## 2019-01-18 MED ORDER — FAMOTIDINE IN NACL 20-0.9 MG/50ML-% IV SOLN
20.0000 mg | Freq: Two times a day (BID) | INTRAVENOUS | Status: DC
  Administered 2019-01-18: 20 mg via INTRAVENOUS
  Filled 2019-01-18: qty 50

## 2019-01-18 MED ORDER — ZINC SULFATE 220 (50 ZN) MG PO CAPS
220.0000 mg | ORAL_CAPSULE | Freq: Every day | ORAL | Status: DC
  Administered 2019-01-18 – 2019-01-23 (×6): 220 mg via ORAL
  Filled 2019-01-18 (×6): qty 1

## 2019-01-18 MED ORDER — DEXMEDETOMIDINE HCL IN NACL 400 MCG/100ML IV SOLN
0.2000 ug/kg/h | INTRAVENOUS | Status: DC
  Administered 2019-01-18 (×2): 0.9 ug/kg/h via INTRAVENOUS
  Administered 2019-01-19: 1 ug/kg/h via INTRAVENOUS
  Administered 2019-01-20: 0.4 ug/kg/h via INTRAVENOUS
  Filled 2019-01-18 (×3): qty 100

## 2019-01-18 MED ORDER — METHYLPREDNISOLONE SODIUM SUCC 40 MG IJ SOLR
0.5000 mg/kg | Freq: Two times a day (BID) | INTRAMUSCULAR | Status: DC
  Administered 2019-01-18 – 2019-01-23 (×10): 26.4 mg via INTRAVENOUS
  Filled 2019-01-18 (×10): qty 1

## 2019-01-18 MED ORDER — SODIUM CHLORIDE 0.9 % IV SOLN
100.0000 mg | Freq: Every day | INTRAVENOUS | Status: AC
  Administered 2019-01-19 – 2019-01-21 (×3): 100 mg via INTRAVENOUS
  Filled 2019-01-18 (×3): qty 20

## 2019-01-18 MED ORDER — ENOXAPARIN SODIUM 40 MG/0.4ML ~~LOC~~ SOLN: 40.0000 mg | SUBCUTANEOUS | Status: DC

## 2019-01-18 NOTE — Plan of Care (Signed)
Pt was admitted to [facility] ICU today. Arrived intubated with vent settings at 40% FiO2 PeeP 5 RR 18 TV 400. Able to follow commands and answer yes or no questions. Precedex was decreased to 0.9 mcg/kg/h.   Arrived on the unit with Stage I pressure injuries on her buttocks, coccyx, and both heels.   Pt belongings on arrival include dentures, socks, and a pillow.   Problem: Education: Goal: Knowledge of risk factors and measures for prevention of condition will improve Outcome: Progressing   Problem: Coping: Goal: Psychosocial and spiritual needs will be supported Outcome: Progressing   Problem: Respiratory: Goal: Will maintain a patent airway Outcome: Progressing Goal: Complications related to the disease process, condition or treatment will be avoided or minimized Outcome: Progressing   Problem: Activity: Goal: Ability to tolerate increased activity will improve Outcome: Progressing   Problem: Respiratory: Goal: Ability to maintain a clear airway and adequate ventilation will improve Outcome: Progressing

## 2019-01-18 NOTE — H&P (Signed)
NAME:  Kaylee Glover, MRN:  [id number], DOB:  [birth year], LOS: 0 ADMISSION DATE:  02/07/2019,  CHIEF COMPLAINT: Acute hypoxic and hypercapnic respiratory failure  Brief History     History of present illness   86 year old woman with a history of atrial fibrillation (not on anticoagulation), mild MS, AS and diastolic CHF.  Also with COPD and chronic hypoxemic respiratory failure on 2 L/min at baseline.  She has had 4 to 5 days of viral symptoms, initially negative COVID-19 test as an outpatient.  Progressively worsened and evaluated in [facility] ED.  Evolved hypercapnic and hypoxemic respiratory failure requiring intubation and mechanical ventilation.  Started dexamethasone, remdesivir, bronchodilators.  Chest x-ray shows hyperinflation with bilateral interstitial infiltrates, possible lower lobe atelectasis and effusions.  She was treated empirically with cefepime for possible healthcare associated pneumonia in the ED.  Transferred to [facility] for further care intubated and sedated.   Past Medical History   has a past medical history of Anemia, Asthma, Atrial fibrillation ([place]), Cancer ([place]), Chronic respiratory failure ([place]), Congestive heart failure (CHF) ([place]), COPD (chronic obstructive pulmonary disease) ([place]), Hypertension, Hypothyroidism, Non-Hodgkin lymphoma ([place]), Osteoarthritis, Parkinson disease ([place]), Pneumonia, and Valvular heart disease.   Significant Hospital Events   Intubated at [facility] 1/1 Transferred to [facility] 1/2  Consults:  PCCM  Procedures:    Significant Diagnostic Tests:    Micro Data:  SARS CoV2 1/1 [name] >> positive Blood cultures 1/1 [name] >>  Respiratory culture 1/2 >>  Urine culture 1/2 >>   Antimicrobials:  Remdesivir 1/1 >> Cefepime 1/1 >> 1/2  Interim history/subjective:  Arrived to ICU on 0.40+ PEEP 5 Awake, able to interact  Objective   Blood pressure (!) 123/47, pulse 64, temperature 98.5 F (36.9 C), temperature source Axillary, resp.  rate 18, height 5\' 3"  (1.6 m), SpO2 100 %.    Vent Mode: PRVC FiO2 (%):  [40 %] 40 % Set Rate:  [18 bmp] 18 bmp Vt Set:  [400 mL] 400 mL PEEP:  [5 cmH20] 5 cmH20 Plateau Pressure:  [14 cmH20] 14 cmH20  No intake or output data in the 24 hours ending 01/21/2019 1502 There were no vitals filed for this visit.  Examination: General: Thin elderly woman, ventilated, no distress HENT: ET tube in place, oropharynx otherwise clear, slightly dry Lungs: Distant, few scattered inspiratory crackles, no wheezing Cardiovascular: Irregular, distant, no murmur Abdomen: Nondistended, positive bowel sounds Extremities: No significant edema Neuro: Wakes easily to voice, nods to questions, follows commands, good upper extremity and lower extremity strength  Resolved Hospital Problem list     Assessment & Plan:  Acute on chronic hypoxemic and hypercapnic respiratory failure due to COVID-19 pneumonia, possible superimposed acute on chronic diastolic CHF  Baseline oxygen need 2 L/min Currently tolerating PEEP 5, FiO2 0.40 Plan PRVC 7 cc/kg, goal plateau pressure less than 30 Diuresis as she can tolerate, does not have central IV access but if placed goal CVP 5-8 Remdesivir for 5 days and corticosteroids for 10 days VAP prevention order set No clear evidence to support bacterial pneumonia.  Will stop cefepime ordered at  [facility], check respiratory cultures and tailor therapy accordingly  COPD without evidence of acute exacerbation Scheduled DuoNeb  Atrial fibrillation with chronic diastolic CHF, possible component of acute diastolic CHF contributing to bilateral infiltrates, effusions On digoxin and diltiazem as an outpatient, on hold for now Not on therapeutic anticoagulation as an outpatient, plan to continue prophylaxis On furosemide 40 mg daily as an outpatient.  Plan to dose diuretics daily  while acutely ill  Acute on chronic anemia Conservative transfusion strategy, goal hemoglobin greater than  7  Hypothyroidism Levothyroxine 50 mcg IV  Toxic metabolic encephalopathy, need for sedating medications for mechanical ventilation Intermittent fentanyl, Versed per PAD protocol   Best practice:  Diet: Initiate TF if not extubated on 1/3 Pain/Anxiety/Delirium protocol (if indicated): Intermittent Versed, fentanyl VAP protocol (if indicated): Ordered DVT prophylaxis: Enoxaparin GI prophylaxis: Pepcid Glucose control: ICU protocol SSI Mobility: BR Code Status: Full Family Communication:  Disposition: ICU  Labs   CBC: Recent Labs  Lab 01/17/2019 1245  WBC 8.9  NEUTROABS 7.9*  HGB 6.9*  HCT 22.4*  MCV 99.6  PLT 126*    Basic Metabolic Panel: Recent Labs  Lab 02/13/2019 1245  NA 145  K 4.3  CL 100  CO2 31  GLUCOSE 95  BUN 54*  CREATININE 1.04*  CALCIUM 8.3*   GFR: Estimated Creatinine Clearance: 33.3 mL/min (A) (by C-G formula based on SCr of 1.04 mg/dL (H)). Recent Labs  Lab 02/10/2019 1245  WBC 8.9    Liver Function Tests: Recent Labs  Lab 02/11/2019 1245  AST 29  ALT 35  ALKPHOS 61  BILITOT 1.1  PROT 4.9*  ALBUMIN 2.8*   No results for input(s): LIPASE, AMYLASE in the last 168 hours. No results for input(s): AMMONIA in the last 168 hours.  ABG No results found for: PHART, PCO2ART, PO2ART, HCO3, TCO2, ACIDBASEDEF, O2SAT   Coagulation Profile: No results for input(s): INR, PROTIME in the last 168 hours.  Cardiac Enzymes: No results for input(s): CKTOTAL, CKMB, CKMBINDEX, TROPONINI in the last 168 hours.  HbA1C: No results found for: HGBA1C  CBG: Recent Labs  Lab 02/16/2019 1300  GLUCAP 98    Review of Systems:   Unable to obtain  Past Medical History  She,  has a past medical history of Anemia, Asthma, Atrial fibrillation ([place]), Cancer ([place]), Chronic respiratory failure ([place]), Congestive heart failure (CHF) ([place]), COPD (chronic obstructive pulmonary disease) ([place]), Hypertension, Hypothyroidism, Non-Hodgkin lymphoma ([place]), Osteoarthritis,  Parkinson disease ([place]), Pneumonia, and Valvular heart disease.   Surgical History    Past Surgical History:  Procedure Laterality Date  . ABDOMINAL HYSTERECTOMY    . CHOLECYSTECTOMY    . MASTECTOMY Right   . ORIF ANKLE FRACTURE Left      Social History   reports that she has quit smoking. She has never used smokeless tobacco. She reports that she does not drink alcohol or use drugs.   Family History   Her family history includes Breast cancer in her mother and sister; CAD in her sister; Diabetes in her brother; Hypertension in her brother.   Allergies Not on File   Home Medications  Prior to Admission medications   Medication Sig Start Date End Date Taking? Authorizing Provider  acetaminophen (TYLENOL) 500 MG tablet Take by mouth.    [provider]  albuterol (VENTOLIN HFA) 108 (90 Base) MCG/ACT inhaler INHALE 2 PUFFS BY MOUTH EVERY 4 HOURS AS NEEDED FOR WHEEZE 01/06/19   [provider]  ascorbic acid (VITAMIN C) 500 MG tablet Take 500 mg by mouth daily.    [provider]  calcium carbonate (OS-CAL) 1250 (500 Ca) MG chewable tablet Chew 1 tablet by mouth daily.    [provider]  cefUROXime (CEFTIN) 250 MG tablet Take 500 mg by mouth 2 (two) times daily with a meal.    [provider]  cetirizine (ZYRTEC) 10 MG tablet Take by mouth.    [provider]  cholecalciferol (VITAMIN D3) 25 MCG (1000 UT) tablet Take 1,000 Units by mouth daily.    [provider]  digoxin (LANOXIN) 0.125 MG tablet Take 125 mcg by mouth daily. 11/23/18   [provider]  diltiazem (CARDIZEM) 60 MG tablet 60 mg daily. 01/02/19   [provider]  docusate sodium (COLACE) 100 MG capsule Take 100 mg by mouth 2 (two) times daily.    [provider]  fluticasone (FLONASE) 50 MCG/ACT nasal spray 1 spray by Each Nare route daily. 12/04/18   [provider]  fluticasone furoate-vilanterol (BREO ELLIPTA) 100-25  MCG/INH AEPB Inhale 1 puff into the lungs daily.    [provider]  furosemide (LASIX) 40 MG tablet Take by mouth. 07/30/18   [provider]  hydrALAZINE (APRESOLINE) 50 MG tablet Take 50 mg by mouth 3 (three) times daily. 01/02/19   [provider]  isosorbide dinitrate (ISORDIL) 20 MG tablet Take 20 mg by mouth 3 (three) times daily. 01/02/19   [provider]  levothyroxine (SYNTHROID) 100 MCG tablet Take 100 mcg by mouth daily before breakfast.    [provider]  polyethylene glycol (MIRALAX / GLYCOLAX) 17 g packet Take 17 g by mouth daily.    [provider]  sennosides-docusate sodium (SENOKOT-S) 8.6-50 MG tablet Take 1 tablet by mouth as needed for constipation.    [provider]  sertraline (ZOLOFT) 25 MG tablet TAKE 1 TABLET BY MOUTH EVERY DAY 11/27/18   [provider]  tiotropium (SPIRIVA HANDIHALER) 18 MCG inhalation capsule INHALE 1 CAPSULE VIA HANDIHALER ONCE DAILY AT THE SAME TIME EVERY DAY 12/04/18   [provider]  Zinc 50 MG CAPS Take 50 mg by mouth daily.    [provider]     Critical care time: 68     Baltazar Apo, MD, PhD , 3:29 PM [facility] [phone] or if no answer [phone]

## 2019-01-19 ENCOUNTER — Other Ambulatory Visit: Payer: Self-pay

## 2019-01-19 LAB — GLUCOSE, CAPILLARY
Glucose-Capillary: 95 mg/dL (ref 70–99)
Glucose-Capillary: 98 mg/dL (ref 70–99)
Glucose-Capillary: 93 mg/dL (ref 70–99)
Glucose-Capillary: 87 mg/dL (ref 70–99)
Glucose-Capillary: 98 mg/dL (ref 70–99)
Glucose-Capillary: 97 mg/dL (ref 70–99)

## 2019-01-19 LAB — CBC WITH DIFFERENTIAL/PLATELET
Abs Immature Granulocytes: 0.07 10*3/uL (ref 0.00–0.07)
Basophils Absolute: 0 10*3/uL (ref 0.0–0.1)
Basophils Relative: 0 %
Eosinophils Absolute: 0 10*3/uL (ref 0.0–0.5)
Eosinophils Relative: 0 %
HCT: 25.5 % — ABNORMAL LOW (ref 36.0–46.0)
Hemoglobin: 8.1 g/dL — ABNORMAL LOW (ref 12.0–15.0)
Immature Granulocytes: 1 %
Lymphocytes Relative: 6 %
Lymphs Abs: 0.5 10*3/uL — ABNORMAL LOW (ref 0.7–4.0)
MCH: 30.6 pg (ref 26.0–34.0)
MCHC: 31.8 g/dL (ref 30.0–36.0)
MCV: 96.2 fL (ref 80.0–100.0)
Monocytes Absolute: 0.3 10*3/uL (ref 0.1–1.0)
Monocytes Relative: 3 %
Neutro Abs: 7.9 10*3/uL — ABNORMAL HIGH (ref 1.7–7.7)
Neutrophils Relative %: 90 %
Platelets: 123 10*3/uL — ABNORMAL LOW (ref 150–400)
RBC: 2.65 MIL/uL — ABNORMAL LOW (ref 3.87–5.11)
RDW: 15.7 % — ABNORMAL HIGH (ref 11.5–15.5)
WBC: 8.7 10*3/uL (ref 4.0–10.5)
nRBC: 0 % (ref 0.0–0.2)

## 2019-01-19 LAB — COMPREHENSIVE METABOLIC PANEL
ALT: 33 U/L (ref 0–44)
AST: 30 U/L (ref 15–41)
Albumin: 2.8 g/dL — ABNORMAL LOW (ref 3.5–5.0)
Alkaline Phosphatase: 56 U/L (ref 38–126)
Anion gap: 12 (ref 5–15)
BUN: 64 mg/dL — ABNORMAL HIGH (ref 8–23)
CO2: 30 mmol/L (ref 22–32)
Calcium: 8.4 mg/dL — ABNORMAL LOW (ref 8.9–10.3)
Chloride: 103 mmol/L (ref 98–111)
Creatinine, Ser: 1.19 mg/dL — ABNORMAL HIGH (ref 0.44–1.00)
GFR calc Af Amer: 49 mL/min — ABNORMAL LOW (ref >60)
GFR calc non Af Amer: 42 mL/min — ABNORMAL LOW (ref >60)
Glucose, Bld: 93 mg/dL (ref 70–99)
Potassium: 4.5 mmol/L (ref 3.5–5.1)
Sodium: 145 mmol/L (ref 135–145)
Total Bilirubin: 1.5 mg/dL — ABNORMAL HIGH (ref 0.3–1.2)
Total Protein: 5 g/dL — ABNORMAL LOW (ref 6.5–8.1)

## 2019-01-19 LAB — PHOSPHORUS: Phosphorus: 4 mg/dL (ref 2.5–4.6)

## 2019-01-19 LAB — C-REACTIVE PROTEIN: CRP: 7 mg/dL — ABNORMAL HIGH (ref <1.0)

## 2019-01-19 LAB — MAGNESIUM: Magnesium: 2.5 mg/dL — ABNORMAL HIGH (ref 1.7–2.4)

## 2019-01-19 LAB — FERRITIN: Ferritin: 283 ng/mL (ref 11–307)

## 2019-01-19 MED ORDER — IPRATROPIUM-ALBUTEROL 20-100 MCG/ACT IN AERS
1.0000 | INHALATION_SPRAY | Freq: Four times a day (QID) | RESPIRATORY_TRACT | Status: DC
  Administered 2019-01-19 (×2): 1 via RESPIRATORY_TRACT
  Filled 2019-01-19: qty 4

## 2019-01-19 MED ORDER — HEPARIN SODIUM (PORCINE) 10000 UNIT/ML IJ SOLN
7500.0000 [IU] | Freq: Two times a day (BID) | INTRAMUSCULAR | Status: DC
  Administered 2019-01-20 – 2019-01-21 (×3): 7500 [IU] via SUBCUTANEOUS
  Filled 2019-01-19 (×3): qty 1

## 2019-01-19 MED ORDER — CHLORHEXIDINE GLUCONATE CLOTH 2 % EX PADS
6.0000 | MEDICATED_PAD | Freq: Every day | CUTANEOUS | Status: DC
  Administered 2019-01-19 – 2019-01-22 (×4): 6 via TOPICAL

## 2019-01-19 NOTE — Procedures (Signed)
Extubation Procedure Note  Patient Details:   Name: Kaylee Glover DOB: [birth year] MRN: [id number]   Airway Documentation:    Vent end date: 01/19/19 Vent end time: 1120   Evaluation  O2 sats: stable throughout Complications: No apparent complications Patient did tolerate procedure well. Bilateral Breath Sounds: Diminished   Yes   Positive cuff leak noted. Patient placed on [place] 4 L with humidity, no stridor noted. Patient abkle to reach 250 mL using the incentive spirometer.  Bayard Beaver 01/19/2019, 12:58 PM

## 2019-01-19 NOTE — Progress Notes (Signed)
NAME:  Kaylee Glover, MRN:  [id number], DOB:  1932-08-26, LOS: 1 ADMISSION DATE:  02/07/2019,  CHIEF COMPLAINT: Acute hypoxic and hypercapnic respiratory failure  Brief History     History of present illness   86 year old woman with a history of atrial fibrillation (not on anticoagulation), mild MS, AS and diastolic CHF.  Also with COPD and chronic hypoxemic respiratory failure on 2 L/min at baseline.  She has had 4 to 5 days of viral symptoms, initially negative COVID-19 test as an outpatient.  Progressively worsened and evaluated in [facility] ED.  Evolved hypercapnic and hypoxemic respiratory failure requiring intubation and mechanical ventilation.  Started dexamethasone, remdesivir, bronchodilators.  Chest x-ray shows hyperinflation with bilateral interstitial infiltrates, possible lower lobe atelectasis and effusions.  She was treated empirically with cefepime for possible healthcare associated pneumonia in the ED.  Transferred to [facility] for further care intubated and sedated.   Past Medical History   has a past medical history of Anemia, Asthma, Atrial fibrillation ([place]), Cancer ([place]), Chronic respiratory failure ([place]), Congestive heart failure (CHF) ([place]), COPD (chronic obstructive pulmonary disease) ([place]), Hypertension, Hypothyroidism, Non-Hodgkin lymphoma ([place]), Osteoarthritis, Parkinson disease ([place]), Pneumonia, and Valvular heart disease.   Significant Hospital Events   Intubated at [facility] 1/1 Transferred to [facility] 1/2  Consults:  PCCM  Procedures:    Significant Diagnostic Tests:    Micro Data:  SARS CoV2 1/1 [name] >> positive Blood cultures 1/1 [name] >>  Respiratory culture 1/2 >>  Urine culture 1/2 >>   Antimicrobials:  Remdesivir 1/1 >> Cefepime 1/1 >> 1/2  Interim history/subjective:  0.40, PEEP 5 I/O- 400 cc total Tolerating PSV this am, interacts   Objective   Blood pressure (!) 125/52, pulse 76, temperature 98.3 F (36.8 C), temperature source  Axillary, resp. rate 16, height 5' 2.99" (1.6 m), weight 34.9 kg, SpO2 100 %.    Vent Mode: PRVC FiO2 (%):  [40 %] 40 % Set Rate:  [18 bmp] 18 bmp Vt Set:  [400 mL] 400 mL PEEP:  [5 cmH20] 5 cmH20 Plateau Pressure:  [13 cmH20-16 cmH20] 14 cmH20   Intake/Output Summary (Last 24 hours) at 01/19/2019 [id number] Last data filed at 01/19/2019 0600 Gross per 24 hour  Intake 485.84 ml  Output 890 ml  Net -404.16 ml   Filed Weights   01/20/2019 1907  Weight: 34.9 kg    Examination: General: Thin elderly woman, ventilated, no distress HENT: ET tube in place, oropharynx otherwise clear, slightly dry Lungs: Distant, few scattered inspiratory crackles, no wheezing Cardiovascular: Irregular, distant, no murmur Abdomen: Nondistended, positive bowel sounds Extremities: No significant edema Neuro: Wakes easily to voice, nods to questions, follows commands, good upper extremity and lower extremity strength  Resolved Hospital Problem list     Assessment & Plan:  Acute on chronic hypoxemic and hypercapnic respiratory failure due to COVID-19 pneumonia, possible superimposed acute on chronic diastolic CHF  Baseline oxygen need 2 L/min Tolerating PEEP 5, FiO2 0.40 PRVC, currently 7 cc/kg.  May be able to move towards spontaneous breathing trials soon.  Suspect that volume removal will be important given her history of diastolic CHF.  Diuresis as she can tolerate May be an extubation candidate today 1/3 VAP prevention order set Remdesivir for 5 days, corticosteroids for 10 days No clear evidence to support bacterial pneumonia.  Follow culture data.  Antibiotics held  COPD without evidence of acute exacerbation DuoNeb scheduled  Atrial fibrillation with chronic diastolic CHF, possible component of acute diastolic CHF contributing to bilateral infiltrates,  effusions Outpatient digoxin, diltiazem held Prophylactic anticoagulation Dose diuretics daily.  Slight increase in serum creatinine on 1/3  Acute  on chronic anemia Conservative transfusion strategy, goal hemoglobin > 7  Hypothyroidism Synthroid 50 mcg IV  Toxic metabolic encephalopathy, need for sedating medications for mechanical ventilation Intermittent fentanyl, Versed.  She was started on Precedex at presentation, hope to avoid continuous infusions of possible but can continue the Precedex if she is requiring   Best practice:  Diet: Initiate TF if not extubated on 1/3 Pain/Anxiety/Delirium protocol (if indicated): Intermittent Versed, fentanyl VAP protocol (if indicated): Ordered DVT prophylaxis: Enoxaparin GI prophylaxis: Pepcid Glucose control: ICU protocol SSI Mobility: BR Code Status: Full Family Communication: Spoke with the patient's sister on 1/3. She would like for [place] to also the son [place] Disposition: ICU  Labs   CBC: Recent Labs  Lab 01/30/2019 1245 01/19/19 0300  WBC 8.9 8.7  NEUTROABS 7.9* 7.9*  HGB 6.9* 8.1*  HCT 22.4* 25.5*  MCV 99.6 96.2  PLT 126* 123*    Basic Metabolic Panel: Recent Labs  Lab 02/03/2019 1245 01/19/19 0300  NA 145 145  K 4.3 4.5  CL 100 103  CO2 31 30  GLUCOSE 95 93  BUN 54* 64*  CREATININE 1.04* 1.19*  CALCIUM 8.3* 8.4*  MG  --  2.5*  PHOS  --  4.0   GFR: Estimated Creatinine Clearance: 19.4 mL/min (A) (by C-G formula based on SCr of 1.19 mg/dL (H)). Recent Labs  Lab 02/08/2019 1245 01/19/19 0300  PROCALCITON 0.59  --   WBC 8.9 8.7    Liver Function Tests: Recent Labs  Lab 01/29/2019 1245 01/19/19 0300  AST 29 30  ALT 35 33  ALKPHOS 61 56  BILITOT 1.1 1.5*  PROT 4.9* 5.0*  ALBUMIN 2.8* 2.8*   No results for input(s): LIPASE, AMYLASE in the last 168 hours. No results for input(s): AMMONIA in the last 168 hours.  ABG No results found for: PHART, PCO2ART, PO2ART, HCO3, TCO2, ACIDBASEDEF, O2SAT   Coagulation Profile: No results for input(s): INR, PROTIME in the last 168 hours.  Cardiac Enzymes: No results for input(s): CKTOTAL, CKMB,  CKMBINDEX, TROPONINI in the last 168 hours.  HbA1C: No results found for: HGBA1C  CBG: Recent Labs  Lab 01/19/2019 1529 02/02/2019 1936 02/11/2019 2337 01/19/19 0319 01/19/19 0741  GLUCAP 87 83 84 95 98     Critical care time: 33 min     Baltazar Apo, MD, PhD 01/19/2019, 9:28 AM [facility] [phone] or if no answer [phone]

## 2019-01-20 ENCOUNTER — Inpatient Hospital Stay (HOSPITAL_COMMUNITY): Payer: Medicare Other

## 2019-01-20 ENCOUNTER — Inpatient Hospital Stay: Payer: Self-pay

## 2019-01-20 ENCOUNTER — Inpatient Hospital Stay (HOSPITAL_COMMUNITY): Payer: Medicare Other | Admitting: Anesthesiology

## 2019-01-20 DIAGNOSIS — I5031 Acute diastolic (congestive) heart failure: Secondary | ICD-10-CM

## 2019-01-20 LAB — POCT I-STAT 7, (LYTES, BLD GAS, ICA,H+H)
Acid-Base Excess: 5 mmol/L — ABNORMAL HIGH (ref 0.0–2.0)
Bicarbonate: 32.8 mmol/L — ABNORMAL HIGH (ref 20.0–28.0)
Calcium, Ion: 1.28 mmol/L (ref 1.15–1.40)
Calcium, Ion: 1.21 mmol/L (ref 1.15–1.40)
HCT: 29 % — ABNORMAL LOW (ref 36.0–46.0)
HCT: 27 % — ABNORMAL LOW (ref 36.0–46.0)
Hemoglobin: 9.9 g/dL — ABNORMAL LOW (ref 12.0–15.0)
Hemoglobin: 9.2 g/dL — ABNORMAL LOW (ref 12.0–15.0)
O2 Saturation: 99 %
Patient temperature: 36.6
Patient temperature: 98.3
Potassium: 4.6 mmol/L (ref 3.5–5.1)
Potassium: 4.2 mmol/L (ref 3.5–5.1)
Sodium: 142 mmol/L (ref 135–145)
Sodium: 141 mmol/L (ref 135–145)
TCO2: 35 mmol/L — ABNORMAL HIGH (ref 22–32)
pCO2 arterial: >97 mmHg (ref 32.0–48.0)
pCO2 arterial: 68.6 mmHg (ref 32.0–48.0)
pH, Arterial: 7.027 — CL (ref 7.350–7.450)
pH, Arterial: 7.287 — ABNORMAL LOW (ref 7.350–7.450)
pO2, Arterial: 123 mmHg — ABNORMAL HIGH (ref 83.0–108.0)
pO2, Arterial: 157 mmHg — ABNORMAL HIGH (ref 83.0–108.0)

## 2019-01-20 LAB — CBC WITH DIFFERENTIAL/PLATELET
Abs Immature Granulocytes: 0.06 10*3/uL (ref 0.00–0.07)
Basophils Absolute: 0 10*3/uL (ref 0.0–0.1)
Basophils Relative: 0 %
Eosinophils Absolute: 0 10*3/uL (ref 0.0–0.5)
Eosinophils Relative: 0 %
HCT: 29 % — ABNORMAL LOW (ref 36.0–46.0)
Hemoglobin: 8.6 g/dL — ABNORMAL LOW (ref 12.0–15.0)
Immature Granulocytes: 1 %
Lymphocytes Relative: 5 %
Lymphs Abs: 0.4 10*3/uL — ABNORMAL LOW (ref 0.7–4.0)
MCH: 30 pg (ref 26.0–34.0)
MCHC: 29.7 g/dL — ABNORMAL LOW (ref 30.0–36.0)
MCV: 101 fL — ABNORMAL HIGH (ref 80.0–100.0)
Monocytes Absolute: 0.5 10*3/uL (ref 0.1–1.0)
Monocytes Relative: 6 %
Neutro Abs: 7.5 10*3/uL (ref 1.7–7.7)
Neutrophils Relative %: 88 %
Platelets: 147 10*3/uL — ABNORMAL LOW (ref 150–400)
RBC: 2.87 MIL/uL — ABNORMAL LOW (ref 3.87–5.11)
RDW: 15.9 % — ABNORMAL HIGH (ref 11.5–15.5)
WBC: 8.4 10*3/uL (ref 4.0–10.5)
nRBC: 0 % (ref 0.0–0.2)

## 2019-01-20 LAB — GLUCOSE, CAPILLARY
Glucose-Capillary: 144 mg/dL — ABNORMAL HIGH (ref 70–99)
Glucose-Capillary: 132 mg/dL — ABNORMAL HIGH (ref 70–99)
Glucose-Capillary: 100 mg/dL — ABNORMAL HIGH (ref 70–99)
Glucose-Capillary: 89 mg/dL (ref 70–99)
Glucose-Capillary: 116 mg/dL — ABNORMAL HIGH (ref 70–99)

## 2019-01-20 LAB — COMPREHENSIVE METABOLIC PANEL
ALT: 45 U/L — ABNORMAL HIGH (ref 0–44)
AST: 42 U/L — ABNORMAL HIGH (ref 15–41)
Albumin: 3.1 g/dL — ABNORMAL LOW (ref 3.5–5.0)
Alkaline Phosphatase: 58 U/L (ref 38–126)
Anion gap: 14 (ref 5–15)
BUN: 65 mg/dL — ABNORMAL HIGH (ref 8–23)
CO2: 28 mmol/L (ref 22–32)
Calcium: 8.7 mg/dL — ABNORMAL LOW (ref 8.9–10.3)
Chloride: 101 mmol/L (ref 98–111)
Creatinine, Ser: 1.21 mg/dL — ABNORMAL HIGH (ref 0.44–1.00)
GFR calc Af Amer: 48 mL/min — ABNORMAL LOW (ref >60)
GFR calc non Af Amer: 41 mL/min — ABNORMAL LOW (ref >60)
Glucose, Bld: 163 mg/dL — ABNORMAL HIGH (ref 70–99)
Potassium: 4.6 mmol/L (ref 3.5–5.1)
Sodium: 143 mmol/L (ref 135–145)
Total Bilirubin: 0.9 mg/dL (ref 0.3–1.2)
Total Protein: 5.6 g/dL — ABNORMAL LOW (ref 6.5–8.1)

## 2019-01-20 LAB — C-REACTIVE PROTEIN: CRP: 5.2 mg/dL — ABNORMAL HIGH (ref <1.0)

## 2019-01-20 LAB — URINE CULTURE

## 2019-01-20 LAB — PHOSPHORUS
Phosphorus: 5.8 mg/dL — ABNORMAL HIGH (ref 2.5–4.6)
Phosphorus: 3.6 mg/dL (ref 2.5–4.6)

## 2019-01-20 LAB — TRIGLYCERIDES: Triglycerides: 74 mg/dL (ref <150)

## 2019-01-20 LAB — FERRITIN: Ferritin: 607 ng/mL — ABNORMAL HIGH (ref 11–307)

## 2019-01-20 LAB — MAGNESIUM
Magnesium: 2.8 mg/dL — ABNORMAL HIGH (ref 1.7–2.4)
Magnesium: 2.5 mg/dL — ABNORMAL HIGH (ref 1.7–2.4)

## 2019-01-20 MED ORDER — FENTANYL CITRATE (PF) 100 MCG/2ML IJ SOLN
INTRAMUSCULAR | Status: AC
  Filled 2019-01-20: qty 2

## 2019-01-20 MED ORDER — FENTANYL CITRATE (PF) 100 MCG/2ML IJ SOLN
25.0000 ug | INTRAMUSCULAR | Status: DC | PRN
  Administered 2019-01-20 – 2019-01-21 (×9): 100 ug via INTRAVENOUS
  Filled 2019-01-20 (×8): qty 2

## 2019-01-20 MED ORDER — LIP MEDEX EX OINT
TOPICAL_OINTMENT | CUTANEOUS | Status: DC | PRN
  Filled 2019-01-20: qty 7

## 2019-01-20 MED ORDER — SERTRALINE HCL 25 MG PO TABS
25.0000 mg | ORAL_TABLET | Freq: Every day | ORAL | Status: DC
Start: 2019-01-20 — End: 2019-01-23
  Administered 2019-01-20 – 2019-01-23 (×4): 25 mg
  Filled 2019-01-20 (×4): qty 1

## 2019-01-20 MED ORDER — ROCURONIUM BROMIDE 10 MG/ML (PF) SYRINGE
PREFILLED_SYRINGE | INTRAVENOUS | Status: AC
  Filled 2019-01-20: qty 10

## 2019-01-20 MED ORDER — MIDAZOLAM 50MG/50ML (1MG/ML) PREMIX INFUSION
INTRAVENOUS | Status: AC
  Administered 2019-01-20: 2 mg/h
  Filled 2019-01-20: qty 50

## 2019-01-20 MED ORDER — MIDAZOLAM HCL 2 MG/2ML IJ SOLN
2.0000 mg | INTRAMUSCULAR | Status: DC | PRN
  Administered 2019-01-20 – 2019-01-21 (×4): 2 mg via INTRAVENOUS
  Filled 2019-01-20 (×6): qty 2

## 2019-01-20 MED ORDER — VITAL HIGH PROTEIN PO LIQD
1000.0000 mL | ORAL | Status: DC
  Administered 2019-01-20 – 2019-01-22 (×3): 1000 mL

## 2019-01-20 MED ORDER — FAMOTIDINE IN NACL 20-0.9 MG/50ML-% IV SOLN: 20.0000 mg | INTRAVENOUS | Status: DC

## 2019-01-20 MED ORDER — IPRATROPIUM-ALBUTEROL 0.5-2.5 (3) MG/3ML IN SOLN
3.0000 mL | Freq: Four times a day (QID) | RESPIRATORY_TRACT | Status: DC
  Administered 2019-01-20 – 2019-01-21 (×2): 3 mL via RESPIRATORY_TRACT
  Filled 2019-01-20 (×3): qty 3

## 2019-01-20 MED ORDER — VECURONIUM BROMIDE 10 MG IV SOLR
INTRAVENOUS | Status: AC
  Filled 2019-01-20: qty 10

## 2019-01-20 MED ORDER — STERILE WATER FOR INJECTION IJ SOLN
INTRAMUSCULAR | Status: AC
  Filled 2019-01-20: qty 10

## 2019-01-20 MED ORDER — CHLORHEXIDINE GLUCONATE 0.12% ORAL RINSE (MEDLINE KIT)
15.0000 mL | Freq: Two times a day (BID) | OROMUCOSAL | Status: DC
  Administered 2019-01-20 – 2019-01-21 (×4): 15 mL via OROMUCOSAL

## 2019-01-20 MED ORDER — ETOMIDATE 2 MG/ML IV SOLN
10.0000 mg | Freq: Once | INTRAVENOUS | Status: AC
  Administered 2019-01-20: 10 mg via INTRAVENOUS

## 2019-01-20 MED ORDER — PRO-STAT SUGAR FREE PO LIQD
30.0000 mL | Freq: Two times a day (BID) | ORAL | Status: DC
  Administered 2019-01-20 – 2019-01-23 (×7): 30 mL
  Filled 2019-01-20 (×7): qty 30

## 2019-01-20 MED ORDER — PROPOFOL 10 MG/ML IV BOLUS
INTRAVENOUS | Status: AC
  Filled 2019-01-20: qty 20

## 2019-01-20 MED ORDER — NALOXONE HCL 0.4 MG/ML IJ SOLN
INTRAMUSCULAR | Status: AC
  Administered 2019-01-20: 0.4 mg
  Filled 2019-01-20: qty 1

## 2019-01-20 MED ORDER — DEXMEDETOMIDINE HCL IN NACL 400 MCG/100ML IV SOLN
0.4000 ug/kg/h | INTRAVENOUS | Status: DC
  Administered 2019-01-20 (×2): 0.4 ug/kg/h via INTRAVENOUS
  Administered 2019-01-20 – 2019-01-21 (×2): 1.2 ug/kg/h via INTRAVENOUS
  Filled 2019-01-20 (×2): qty 100

## 2019-01-20 MED ORDER — ETOMIDATE 2 MG/ML IV SOLN
INTRAVENOUS | Status: AC
  Filled 2019-01-20: qty 20

## 2019-01-20 MED ORDER — FAMOTIDINE 40 MG/5ML PO SUSR
20.0000 mg | Freq: Every day | ORAL | Status: DC
  Filled 2019-01-20: qty 2.5

## 2019-01-20 MED ORDER — ROCURONIUM BROMIDE 50 MG/5ML IV SOLN
80.0000 mg | Freq: Once | INTRAVENOUS | Status: AC
  Administered 2019-01-20: 80 mg via INTRAVENOUS
  Filled 2019-01-20: qty 8

## 2019-01-20 MED ORDER — SUCCINYLCHOLINE CHLORIDE 200 MG/10ML IV SOSY
PREFILLED_SYRINGE | INTRAVENOUS | Status: AC
  Filled 2019-01-20: qty 10

## 2019-01-20 MED ORDER — HYDRALAZINE HCL 25 MG PO TABS
25.0000 mg | ORAL_TABLET | Freq: Three times a day (TID) | ORAL | Status: DC
  Administered 2019-01-20 – 2019-01-23 (×7): 25 mg
  Filled 2019-01-20 (×8): qty 1

## 2019-01-20 MED ORDER — PROPOFOL 1000 MG/100ML IV EMUL
0.0000 ug/kg/min | INTRAVENOUS | Status: DC
  Administered 2019-01-20: 10 ug/kg/min via INTRAVENOUS
  Filled 2019-01-20: qty 100

## 2019-01-20 MED ORDER — MIDAZOLAM HCL 2 MG/2ML IJ SOLN
INTRAMUSCULAR | Status: AC
  Filled 2019-01-20: qty 4

## 2019-01-20 MED ORDER — ORAL CARE MOUTH RINSE
15.0000 mL | OROMUCOSAL | Status: DC
  Administered 2019-01-20 – 2019-01-21 (×14): 15 mL via OROMUCOSAL

## 2019-01-20 MED ORDER — SERTRALINE HCL 20 MG/ML PO CONC: 25.0000 mg | Freq: Every day | ORAL | Status: DC

## 2019-01-20 MED ORDER — SODIUM CHLORIDE 0.9% FLUSH
10.0000 mL | Freq: Two times a day (BID) | INTRAVENOUS | Status: DC
  Administered 2019-01-20: 20 mL
  Administered 2019-01-21 – 2019-01-23 (×5): 10 mL

## 2019-01-20 MED ORDER — SODIUM CHLORIDE 0.9% FLUSH: 10.0000 mL | INTRAVENOUS | Status: DC | PRN

## 2019-01-20 MED ORDER — FUROSEMIDE 10 MG/ML IJ SOLN
40.0000 mg | Freq: Two times a day (BID) | INTRAMUSCULAR | Status: DC
  Administered 2019-01-20 – 2019-01-23 (×7): 40 mg via INTRAVENOUS
  Filled 2019-01-20 (×7): qty 4

## 2019-01-20 MED ORDER — FAMOTIDINE IN NACL 20-0.9 MG/50ML-% IV SOLN: 20.0000 mg | Freq: Two times a day (BID) | INTRAVENOUS | Status: DC

## 2019-01-20 MED ORDER — PHENYLEPHRINE HCL-NACL 10-0.9 MG/250ML-% IV SOLN
INTRAVENOUS | Status: AC
  Administered 2019-01-20: 10 mg
  Filled 2019-01-20: qty 250

## 2019-01-20 MED ORDER — FENTANYL CITRATE (PF) 100 MCG/2ML IJ SOLN
25.0000 ug | INTRAMUSCULAR | Status: DC | PRN
  Administered 2019-01-20: 25 ug via INTRAVENOUS
  Filled 2019-01-20 (×2): qty 2

## 2019-01-20 MED ORDER — LEVOTHYROXINE SODIUM 100 MCG PO TABS
100.0000 ug | ORAL_TABLET | Freq: Every day | ORAL | Status: DC
  Administered 2019-01-21 – 2019-01-23 (×3): 100 ug
  Filled 2019-01-20 (×5): qty 1

## 2019-01-20 MED ORDER — FAMOTIDINE 40 MG/5ML PO SUSR
20.0000 mg | Freq: Every day | ORAL | Status: DC
  Administered 2019-01-20: 20 mg via ORAL
  Filled 2019-01-20: qty 2.5

## 2019-01-20 NOTE — Anesthesia Procedure Notes (Signed)
Procedure Name: Intubation Date/Time: 01/20/2019 4:25 AM Performed by: Renato Shin, CRNA Pre-anesthesia Checklist: Patient identified, Emergency Drugs available, Suction available and Patient being monitored Patient Re-evaluated:Patient Re-evaluated prior to induction Oxygen Delivery Method: Circle system utilized Preoxygenation: Pre-oxygenation with 100% oxygen Induction Type: IV induction and Rapid sequence Laryngoscope Size: Glidescope and 3 Grade View: Grade I Tube type: Oral Tube size: 7.5 mm Number of attempts: 1 Airway Equipment and Method: Oral airway,  Video-laryngoscopy and Rigid stylet Placement Confirmation: ETT inserted through vocal cords under direct vision,  positive ETCO2 and breath sounds checked- equal and bilateral Secured at: 21 cm Tube secured with: Tape Dental Injury: Teeth and Oropharynx as per pre-operative assessment

## 2019-01-20 NOTE — Progress Notes (Signed)
Phone consent obtain from sister Dutch Gray.

## 2019-01-20 NOTE — Progress Notes (Signed)
NAME:  Kaylee Glover, MRN:  [id number], DOB:  26-Jul-1932, LOS: 2 ADMISSION DATE:  01/22/2019,  CHIEF COMPLAINT: Acute hypoxic and hypercapnic respiratory failure  Brief History    86 year old woman with a history of atrial fibrillation (not on anticoagulation), mild MS, AS and diastolic CHF.  Also with COPD and chronic hypoxemic respiratory failure on 2 L/min at baseline.  She has had 4 to 5 days of viral symptoms, initially negative COVID-19 test as an outpatient.  Progressively worsened and evaluated in [facility] ED.  Evolved hypercapnic and hypoxemic respiratory failure requiring intubation and mechanical ventilation.  Started dexamethasone, remdesivir, bronchodilators.  Chest x-ray shows hyperinflation with bilateral interstitial infiltrates, possible lower lobe atelectasis and effusions.  She was treated empirically with cefepime for possible healthcare associated pneumonia in the ED.  Transferred to [facility] for further care intubated and sedated.   Past Medical History   has a past medical history of Anemia, Asthma, Atrial fibrillation ([place]), Cancer ([place]), Chronic respiratory failure ([place]), Congestive heart failure (CHF) ([place]), COPD (chronic obstructive pulmonary disease) ([place]), Hypertension, Hypothyroidism, Non-Hodgkin lymphoma ([place]), Osteoarthritis, Parkinson disease ([place]), Pneumonia, and Valvular heart disease.   Significant Hospital Events   Intubated at [facility] Transferred to [facility] 1/2, intubated 1/3: Extubated 1/4: Worsening work of breathing, progressive hypoxia, progressive somnolence, eventually agonal respiratory efforts reintubated by CRNA  Consults:  PCCM  Procedures:  oett 1/2>>>1/3 >>1/4>>  Significant Diagnostic Tests:    Micro Data:  SARS CoV2 1/1 [name] >> positive Blood cultures 1/1 [name] >>  Respiratory culture 1/2 >>  Urine culture 1/2 >>   Antimicrobials:  Remdesivir 1/1 >> Cefepime 1/1 >> 1/2  Interim history/subjective:  Reintubated last night   Objective   Blood pressure 133/78, pulse 69, temperature (Abnormal) 96.5 F (35.8 C), temperature source Axillary, resp. rate 19, height 5' 2.99" (1.6 m), weight 34.9 kg, SpO2 100 %.    Vent Mode: PRVC FiO2 (%):  [40 %-60 %] 40 % Set Rate:  [18 bmp-24 bmp] 24 bmp Vt Set:  [400 mL] 400 mL PEEP:  [5 cmH20] 5 cmH20 Pressure Support:  [5 cmH20] 5 cmH20 Plateau Pressure:  [20 S192499 cmH20] 21 cmH20   Intake/Output Summary (Last 24 hours) at 01/20/2019 0947 Last data filed at 01/20/2019 0500 Gross per 24 hour  Intake 1542.54 ml  Output 600 ml  Net 942.54 ml   Filed Weights   01/17/2019 1907  Weight: 34.9 kg    Examination: General this is a frail appearing 86 year old white female she is currently sedated on full ventilatory support HEENT normocephalic atraumatic no jugular venous distention appreciated Pulmonary some scattered rhonchi no wheezing equal chest rise bilaterally Ventilator assessment: FiO2 40 PEEP 5 Plateau pressure 21 Cardiac atrial fibrillation without murmur rub or gallop currently rate controlled Abdomen soft not tender no organomegaly GU clear yellow Extremities scattered areas of ecchymosis some dependent edema Neuro sedated   Resolved Hospital Problem list     Assessment & Plan:   Acute on chronic hypoxemic and hypercapnic respiratory failure due to COVID-19 pneumonia, possible superimposed acute on chronic diastolic CHF and COPD Baseline oxygen need 2 L/min Failed extubation, reintubated 1/4 Portable chest x-ray personally reviewed: Endotracheal tubes in satisfactory position. Worsening bilateral aeration right greater than left, appears to be element of both effusion and pulmonary edema Plan Continuing full ventilator support, tidal volume 6 to 8 mL/kg predicted body weight Adjusted minute ventilation for respiratory acidosis, will repeat ABG Plateau pressure goal less than 30, driving pressure goal less than  15. Titrate PEEP and FiO2 for PaO2  target 55-65 IV diuresis, we need to escalate this although may need therapeutic thoracentesis to get her off ventilator if creatinine continues to climb Continue scheduled bronchodilators Complete 5 days of remdesivir and 10 days of systemic Decadron IV sedation, RASS goal -2 A.m. chest x-ray  Atrial fibrillation with acute on chronic diastolic CHF Outpatient digoxin, diltiazem held Not on chronic [facility] Plan Cont tele  Rate control  IV diuresis Add low-dose scheduled Apresoline, at half her home dosing for now  Fluid and electrolyte imbalance and mild AKI: Hyperphosphatemia, Hypermagnesemia Fluid balance +750 mL, serum creatinine increased from 1.19-1.21 Plan IV Lasix aiming for negative balance given worsening effusions Repeat chemistry in a.m.  Acute on chronic anemia -no evidence of bleeding.  Plan Trend cbc Transfuse for hgb < 7  Hypothyroidism Plan Cont synthroid replacement Can transition to VT   Toxic metabolic encephalopathy, need for sedating medications for mechanical ventilation Plan Resumed precedex, need to wean prop off.  PRN fent Home zoloft dosing   Difficult IV access Plan We will ask for PICC line to be placed  Best practice:  Diet: Initiate tube feeds today on 1/4 Pain/Anxiety/Delirium protocol (if indicated): Intermittent Versed, fentanyl VAP protocol (if indicated): Ordered DVT prophylaxis: Enoxaparin GI prophylaxis: Pepcid Glucose control: ICU protocol SSI Mobility: BR Code Status: Full Family Communication: Spoke with the patient's sister on 1/3. She would like for [place] to also the son [place] Disposition: ICU   Critical care time: 32 minutes.      Erick Colace ACNP-BC [place] Pager # [phone] OR # [phone] if no answer

## 2019-01-20 NOTE — Progress Notes (Signed)
Peripherally Inserted Central Catheter/Midline Placement  The IV Nurse has discussed with the patient and/or persons authorized to consent for the patient, the purpose of this procedure and the potential benefits and risks involved with this procedure.  The benefits include less needle sticks, lab draws from the catheter, and the patient may be discharged home with the catheter. Risks include, but not limited to, infection, bleeding, blood clot (thrombus formation), and puncture of an artery; nerve damage and irregular heartbeat and possibility to perform a PICC exchange if needed/ordered by physician.  Alternatives to this procedure were also discussed.  Bard Power PICC patient education guide, fact sheet on infection prevention and patient information card has been provided to patient /or left at bedside.    PICC/Midline Placement Documentation  PICC Double Lumen [id number] PICC Right Basilic 32 cm 0 cm (Active)  Indication for Insertion or Continuance of Line Prolonged intravenous therapies 01/20/19 1537  Exposed Catheter (cm) 0 cm 01/20/19 1537  Site Assessment Clean;Dry;Intact 01/20/19 1537  Lumen #1 Status Flushed;Blood return noted;Saline locked 01/20/19 1537  Lumen #2 Status Flushed;Blood return noted;Saline locked 01/20/19 1537  Dressing Type Transparent 01/20/19 1537  Dressing Status Clean;Dry;Intact;Antimicrobial disc in place 01/20/19 1537  Dressing Change Due 01/27/19 01/20/19 1537       Scotty Court 01/20/2019, 3:40 PM

## 2019-01-20 NOTE — Progress Notes (Signed)
[facility] Progress Note Patient Name: EARNESTEEN AMENT DOB: Jun 04, 1932 MRN: [id number]   Date of Service  01/20/2019  HPI/Events of Note  Agitation on vent despite prop at 18.  eICU Interventions  Ordered 2mg  Versed IV Q2H PRN.  Also ordered Precedex drip.     Intervention Category Minor Interventions: Agitation / anxiety - evaluation and management  Charlott Rakes 01/20/2019, 6:37 AM

## 2019-01-20 NOTE — Progress Notes (Signed)
OT Cancellation Note  Patient Details Name: Kaylee Glover MRN: [id number] DOB: [birth year]   Cancelled Treatment:    Reason Eval/Treat Not Completed: Medical issues which prohibited therapy(Intubated and sedated. Will return as schedule allows.)  [place], OTR/L Acute Rehab Pager: [phone] Office: [phone] 01/20/2019, 7:47 AM

## 2019-01-20 NOTE — Progress Notes (Signed)
PT Cancellation Note  Patient Details Name: Kaylee Glover MRN: [id number] DOB: 04-Sep-1932   Cancelled Treatment:    Reason Eval/Treat Not Completed: Medical issues which prohibited therapy, On ventilator this AM when checked on.   Claretha Cooper 01/20/2019, 5:31 PM  Tresa Endo PT Acute Rehabilitation Services Pager [phone] Office [phone]

## 2019-01-20 NOTE — Progress Notes (Signed)
Rt called to obtain ABG for pt d/t increasing unresponsiveness.  RT ran ABG and MD asked RT to place pt on bipap until CRNA came to intubate.  Pt was on 161/8 100% on bipap but was not tolerating.  Pt was quickly intubated and placed on previous ventilator settings.  ABG will follow after 1 hour.

## 2019-01-20 NOTE — Progress Notes (Signed)
Patient with labored/purse lipped breathing during shift with respirations being tachypneic. Patient verbalized feelings of being anxious and nervous after midnight, Fentanyl 49mcg given IV push. O2 sats dropped and NRB @ 15L placed on patient. Sats maintained at 100%. Patient became increasingly difficult to arouse and Narcan 0.4mg  given IV push with no response. Respirations became more agonal and [place] MD notified. Orders received to intubate patient. Patient intubated at 0430 by CRNA per MD orders. Vent settings Fio2 40%, PEEP 5, RR 24, TV 400. Tube placement verified by radiology. Patient's son, Peachie Hegland, notified of patient's current status.

## 2019-01-20 NOTE — Progress Notes (Signed)
eLink Physician-Brief Progress Note Patient Name: Kaylee Glover DOB: [birth year] MRN: [id number]   Date of Service  01/20/2019  HPI/Events of Note  Called for respiratory distress. When I evaluated the patient by in-room camera I see the patient is having slow gasping respirations. Maintaining saturation in mid-high 90s but requiring Ewa Gentry + NRB in order to do so. ABG obtained and showed severe hypercarbic respiratory failure (ABG 7.0 / >97).  eICU Interventions  Attempted to call family to discuss change in code status but they did not pick up. Voicemail left by RN.  Patient is full code and has failed a trial of extubation. We will proceed to re-intubate. CRNA called. Ordered for etomidate 10mg  and rocuronium 80mg  for intubation.     Intervention Category Major Interventions: Respiratory failure - evaluation and management  Marily Lente Sheilyn Boehlke 01/20/2019, 4:14 AM

## 2019-01-21 ENCOUNTER — Inpatient Hospital Stay (HOSPITAL_COMMUNITY): Payer: Medicare Other

## 2019-01-21 DIAGNOSIS — J90 Pleural effusion, not elsewhere classified: Secondary | ICD-10-CM

## 2019-01-21 DIAGNOSIS — L8990 Pressure ulcer of unspecified site, unspecified stage: Secondary | ICD-10-CM | POA: Insufficient documentation

## 2019-01-21 LAB — GLUCOSE, CAPILLARY
Glucose-Capillary: 118 mg/dL — ABNORMAL HIGH (ref 70–99)
Glucose-Capillary: 123 mg/dL — ABNORMAL HIGH (ref 70–99)
Glucose-Capillary: 127 mg/dL — ABNORMAL HIGH (ref 70–99)
Glucose-Capillary: 132 mg/dL — ABNORMAL HIGH (ref 70–99)
Glucose-Capillary: 138 mg/dL — ABNORMAL HIGH (ref 70–99)
Glucose-Capillary: 151 mg/dL — ABNORMAL HIGH (ref 70–99)
Glucose-Capillary: 152 mg/dL — ABNORMAL HIGH (ref 70–99)

## 2019-01-21 LAB — CBC WITH DIFFERENTIAL/PLATELET
Abs Immature Granulocytes: 0.04 10*3/uL (ref 0.00–0.07)
Basophils Absolute: 0 10*3/uL (ref 0.0–0.1)
Basophils Relative: 0 %
Eosinophils Absolute: 0 10*3/uL (ref 0.0–0.5)
Eosinophils Relative: 0 %
HCT: 21.3 % — ABNORMAL LOW (ref 36.0–46.0)
Hemoglobin: 6.8 g/dL — CL (ref 12.0–15.0)
Immature Granulocytes: 1 %
Lymphocytes Relative: 11 %
Lymphs Abs: 0.6 10*3/uL — ABNORMAL LOW (ref 0.7–4.0)
MCH: 30.8 pg (ref 26.0–34.0)
MCHC: 31.9 g/dL (ref 30.0–36.0)
MCV: 96.4 fL (ref 80.0–100.0)
Monocytes Absolute: 0.4 10*3/uL (ref 0.1–1.0)
Monocytes Relative: 7 %
Neutro Abs: 4.4 10*3/uL (ref 1.7–7.7)
Neutrophils Relative %: 81 %
Platelets: 110 10*3/uL — ABNORMAL LOW (ref 150–400)
RBC: 2.21 MIL/uL — ABNORMAL LOW (ref 3.87–5.11)
RDW: 15.2 % (ref 11.5–15.5)
WBC: 5.5 10*3/uL (ref 4.0–10.5)
nRBC: 0 % (ref 0.0–0.2)

## 2019-01-21 LAB — COMPREHENSIVE METABOLIC PANEL
ALT: 37 U/L (ref 0–44)
AST: 24 U/L (ref 15–41)
Albumin: 2.7 g/dL — ABNORMAL LOW (ref 3.5–5.0)
Alkaline Phosphatase: 52 U/L (ref 38–126)
Anion gap: 11 (ref 5–15)
BUN: 75 mg/dL — ABNORMAL HIGH (ref 8–23)
CO2: 31 mmol/L (ref 22–32)
Calcium: 8.2 mg/dL — ABNORMAL LOW (ref 8.9–10.3)
Chloride: 102 mmol/L (ref 98–111)
Creatinine, Ser: 1.11 mg/dL — ABNORMAL HIGH (ref 0.44–1.00)
GFR calc Af Amer: 53 mL/min — ABNORMAL LOW (ref >60)
GFR calc non Af Amer: 46 mL/min — ABNORMAL LOW (ref >60)
Glucose, Bld: 147 mg/dL — ABNORMAL HIGH (ref 70–99)
Potassium: 3.6 mmol/L (ref 3.5–5.1)
Sodium: 144 mmol/L (ref 135–145)
Total Bilirubin: 0.7 mg/dL (ref 0.3–1.2)
Total Protein: 4.3 g/dL — ABNORMAL LOW (ref 6.5–8.1)

## 2019-01-21 LAB — HEMOGLOBIN AND HEMATOCRIT, BLOOD
HCT: 30.5 % — ABNORMAL LOW (ref 36.0–46.0)
Hemoglobin: 9.8 g/dL — ABNORMAL LOW (ref 12.0–15.0)

## 2019-01-21 LAB — PHOSPHORUS
Phosphorus: 2.8 mg/dL (ref 2.5–4.6)
Phosphorus: 4.3 mg/dL (ref 2.5–4.6)

## 2019-01-21 LAB — APTT: aPTT: 27 seconds (ref 24–36)

## 2019-01-21 LAB — MAGNESIUM
Magnesium: 2.2 mg/dL (ref 1.7–2.4)
Magnesium: 2.3 mg/dL (ref 1.7–2.4)

## 2019-01-21 LAB — C-REACTIVE PROTEIN: CRP: 3.2 mg/dL — ABNORMAL HIGH (ref <1.0)

## 2019-01-21 LAB — PREPARE RBC (CROSSMATCH)

## 2019-01-21 LAB — TRIGLYCERIDES: Triglycerides: 65 mg/dL (ref <150)

## 2019-01-21 LAB — FERRITIN: Ferritin: 432 ng/mL — ABNORMAL HIGH (ref 11–307)

## 2019-01-21 LAB — PROTIME-INR
INR: 1.1 (ref 0.8–1.2)
Prothrombin Time: 14.4 seconds (ref 11.4–15.2)

## 2019-01-21 LAB — CULTURE, RESPIRATORY W GRAM STAIN: Culture: NORMAL

## 2019-01-21 MED ORDER — POTASSIUM CHLORIDE 20 MEQ/15ML (10%) PO SOLN
40.0000 meq | ORAL | Status: AC
  Administered 2019-01-21 (×2): 40 meq
  Filled 2019-01-21 (×2): qty 30

## 2019-01-21 MED ORDER — DIGOXIN 0.05 MG/ML PO SOLN
0.0625 mg | Freq: Every day | ORAL | Status: DC
  Filled 2019-01-21: qty 2.5

## 2019-01-21 MED ORDER — DIGOXIN 0.0625 MG HALF TABLET
0.0625 mg | ORAL_TABLET | Freq: Every day | ORAL | Status: DC
  Administered 2019-01-21 – 2019-01-23 (×2): 0.0625 mg
  Filled 2019-01-21 (×3): qty 1

## 2019-01-21 MED ORDER — SODIUM CHLORIDE 0.9% IV SOLUTION: Freq: Once | INTRAVENOUS | Status: AC

## 2019-01-21 MED ORDER — DILTIAZEM 12 MG/ML ORAL SUSPENSION
30.0000 mg | Freq: Four times a day (QID) | ORAL | Status: DC
  Administered 2019-01-21 – 2019-01-23 (×7): 30 mg
  Filled 2019-01-21 (×11): qty 3

## 2019-01-21 MED ORDER — ACETAMINOPHEN 325 MG PO TABS
650.0000 mg | ORAL_TABLET | Freq: Once | ORAL | Status: AC
  Administered 2019-01-21: 650 mg via ORAL
  Filled 2019-01-21: qty 2

## 2019-01-21 MED ORDER — PANTOPRAZOLE SODIUM 40 MG IV SOLR
40.0000 mg | Freq: Two times a day (BID) | INTRAVENOUS | Status: DC
  Administered 2019-01-21 – 2019-01-23 (×5): 40 mg via INTRAVENOUS
  Filled 2019-01-21 (×4): qty 40

## 2019-01-21 NOTE — Progress Notes (Addendum)
Notified MD of Hgb 6.8.  See new orders.

## 2019-01-21 NOTE — Progress Notes (Signed)
Made patient's primary nurse Costella Hatcher, RN aware of hgb of 6.8

## 2019-01-21 NOTE — Progress Notes (Signed)
PT Cancellation Note  Patient Details Name: PATINA MCCARTHA MRN: [id number] DOB: 19-Dec-1932   Cancelled Treatment:    Reason Eval/Treat Not Completed: Medical issues which prohibited therapy, patient just extubated will follow up tomorrow for readiness for PT.    Claretha Cooper 01/21/2019, 11:58 AM [place] Pager [phone] Office [phone]

## 2019-01-21 NOTE — Procedures (Signed)
Extubation Procedure Note  Patient Details:   Name: Kaylee Glover DOB: May 07, 1932 MRN: [id number]   Airway Documentation:  Airway 7.5 mm (Active)  Secured at (cm) 22 cm 01/21/19 0801  Measured From Lips 01/21/19 0801  Secured Location Left 01/21/19 0801  Secured By [employer] 01/21/19 0801  Tube Holder Repositioned Yes 01/21/19 0801  Cuff Pressure (cm H2O) 30 cm H2O 01/21/19 0801  Site Condition Dry 01/21/19 0801   Vent end date: 01/19/19 Vent end time: 1120   Evaluation  O2 sats: stable throughout Complications: No apparent complications Patient did tolerate procedure well. Bilateral Breath Sounds: Clear, Diminished   Yes    Bertrum Helmstetter 01/21/2019, 11:50 AM

## 2019-01-21 NOTE — Progress Notes (Addendum)
Afternoon check   post-extubation. Looks about the same. Does exhibit accessory muscle use but denies shortness of breath. She is having frequents bursts of RVR. I worry with her underlying diastolic HF that rate control is much more important.  Plan Add back 1/2 dose of her CCB and dig   Her condition is very guarded. She is very high risk for re-intubation,.    Erick Colace ACNP-BC [place] Pager # [phone] OR # [phone] if no answer

## 2019-01-21 NOTE — Progress Notes (Signed)
[facility] Progress Note Patient Name: Kaylee Glover DOB: [birth year] MRN: [id number]   Date of Service  01/21/2019  HPI/Events of Note  Hgb drop to 6.8 from 8.6. No overt bleeding observed but also required 1u pRBC transfusion on admission for Hgb < 7.  eICU Interventions  Transfuse 1u pRBCs. Check post-tx Hgb and coags.     Intervention Category Intermediate Interventions: Bleeding - evaluation and treatment with blood products  Marily Lente Chauntel [place] 01/21/2019, 6:29 AM

## 2019-01-21 NOTE — Progress Notes (Signed)
Initial Nutrition Assessment  DOCUMENTATION CODES:   Underweight  INTERVENTION:   Monitor off vent and provide nutrition recommendations as appropriate.    NUTRITION DIAGNOSIS:   Increased nutrient needs related to catabolic [id number] as evidenced by estimated needs.  GOAL:   Patient will meet greater than or equal to 90% of their needs  MONITOR:   I & O's  REASON FOR ASSESSMENT:   Consult, Ventilator Enteral/tube feeding initiation and management  ASSESSMENT:   Pt with PMH of mild MS, afib, CHF, COPD on 2L of O2 at baseline who was admitted for hypoxemia due to COVID-19.   Pt meets criteria for underweight, pt likely malnourished.  1/3 extubated  1/4 re-intubated; TF initiated 1/5 extubated  Medications reviewed and include: vitamin C, lasix, SSI every 4 hours, solumedrol, 40 mEq KCl every 4 hours, zinc  Labs reviewed:  CBG's: 127-132    NUTRITION - FOCUSED PHYSICAL EXAM:  Deferred   Diet Order:   Diet Order            Diet NPO time specified  Diet effective now              EDUCATION NEEDS:   No education needs have been identified at this time  Skin:  Skin Assessment: Skin Integrity Issues: Skin Integrity Issues:: Stage I Stage I: sacrum, bil heels  Last BM:  1/3  Height:   Ht Readings from Last 1 Encounters:  02/10/2019 5' 2.99" (1.6 m)    Weight:   Wt Readings from Last 1 Encounters:  01/21/19 34.9 kg    Ideal Body Weight:  52.2 kg  BMI:  Body mass index is 13.63 kg/m.  Estimated Nutritional Needs:   Kcal:  1200-1400  Protein:  55-65 grams  Fluid:  >1.5 L/day  Maylon Peppers RD, LDN, CNSC [phone] Pager [phone] After Hours Pager

## 2019-01-21 NOTE — Progress Notes (Signed)
NAME:  Kaylee Glover, MRN:  [id number], DOB:  07-08-1932, LOS: 3 ADMISSION DATE:  01/17/2019,  CHIEF COMPLAINT: Acute hypoxic and hypercapnic respiratory failure  Brief History    86 year old woman with a history of atrial fibrillation (not on anticoagulation), mild MS, AS and diastolic CHF.  Also with COPD and chronic hypoxemic respiratory failure on 2 L/min at baseline.  She has had 4 to 5 days of viral symptoms, initially negative COVID-19 test as an outpatient.  Progressively worsened and evaluated in [facility] ED.  Evolved hypercapnic and hypoxemic respiratory failure requiring intubation and mechanical ventilation.  Started dexamethasone, remdesivir, bronchodilators.  Chest x-ray shows hyperinflation with bilateral interstitial infiltrates, possible lower lobe atelectasis and effusions.  She was treated empirically with cefepime for possible healthcare associated pneumonia in the ED.  Transferred to [facility] for further care intubated and sedated.   Past Medical History   has a past medical history of Anemia, Asthma, Atrial fibrillation ([place]), Cancer ([place]), Chronic respiratory failure ([place]), Congestive heart failure (CHF) ([place]), COPD (chronic obstructive pulmonary disease) ([place]), Hypertension, Hypothyroidism, Non-Hodgkin lymphoma ([place]), Osteoarthritis, Parkinson disease ([place]), Pneumonia, and Valvular heart disease.   Significant Hospital Events   Intubated at [facility] Transferred to [facility] 1/2, intubated 1/3: Extubated 1/4: Worsening work of breathing, progressive hypoxia, progressive somnolence, eventually agonal respiratory efforts reintubated by CRNA 1/5: Hemoglobin down to 6.8, no obvious bleeding, getting another unit of blood, this reflects her second transfusion since admission.  Heparin stopped, PPI started twice daily.  Inflammatory markers improving.  Extubated after passing spontaneous breathing trial Consults:  PCCM  Procedures:  oett 1/2>>>1/3 >>1/4>>1/5  Significant Diagnostic  Tests:    Micro Data:  SARS CoV2 1/1 [name] >> positive Blood cultures 1/1 [name] >>  Respiratory culture 1/2 >>  Urine culture 1/2 >>   Antimicrobials:  Remdesivir 1/1 >> Cefepime 1/1 >> 1/2  Interim history/subjective:   Wants to get off ventilator Objective   Blood pressure (Abnormal) 95/44, pulse 80, temperature 98.5 F (36.9 C), temperature source Axillary, resp. rate (Abnormal) 21, height 5' 2.99" (1.6 m), weight 34.9 kg, SpO2 100 %.    Vent Mode: PSV;CPAP FiO2 (%):  [40 %] 40 % Set Rate:  [24 bmp] 24 bmp Vt Set:  [400 mL] 400 mL PEEP:  [5 cmH20] 5 cmH20 Pressure Support:  [8 cmH20] 8 cmH20 Plateau Pressure:  [18 cmH20-22 cmH20] 18 cmH20   Intake/Output Summary (Last 24 hours) at 01/21/2019 0917 Last data filed at 01/21/2019 [id number] Gross per 24 hour  Intake 1263.88 ml  Output 2350 ml  Net -1086.12 ml   Filed Weights   01/30/2019 1907 01/21/19 0412  Weight: 34.9 kg 34.9 kg    Examination: General chronically ill-appearing 86 year old white female currently on pressure support ventilation with adequate tidal volumes. HEENT normocephalic atraumatic no jugular venous distention appreciated,  Pulmonary: Scattered rhonchi diminished more on the right some faint wheezing does have strange respiratory effort, cognitively seems to be trying to breathe around endotracheal tube FiO2  PEEP 5 PS 5, vt mid 300 to 400s Some accessory use but looks like more cognitive w/ trying to work out vent  Cardiac regular irregular Abdomen soft not tender Extremities warm and dry dependent edema scattered areas of ecchymosis Neuro awake, follows commands GU clear yellow   Resolved Hospital Problem list   Hypermagnesemia and hyper phosphatemia  Assessment & Plan:   Acute on chronic hypoxemic and hypercapnic respiratory failure due to COVID-19 pneumonia, possible superimposed acute on chronic diastolic CHF and  COPD Baseline oxygen need 2 L/min Failed extubation, reintubated  1/4 Portable chest x-ray personally reviewed, endotracheal tubes in satisfactory position, right greater than left pleural effusions, looks like Aeration has moderately improved Plan Extubate Titrate FiO2 Repeat Lasix today We will reevaluate later this afternoon, if still has significant work of breathing would consider therapeutic thoracentesis Complete 5 days of remdesivir and 10 days of Decadron Incentive spirometry Continuous pulse oximetry Placing NG tube to keep n.p.o. for now  Atrial fibrillation with acute on chronic diastolic CHF Outpatient digoxin, diltiazem held Not on chronic AC Plan Continue rate control  Continue IV diuresis  Added Apresoline at half her home dosing, will hold at this dosing for now as sedation also having effect on blood pressure  Not a candidate for anticoagulation see below   fluid and electrolyte imbalance and mild AKI: Fluid balance slightly improved, now -550, tolerating diuresis, creatinine actually improved Plan Continue IV diuresis as blood pressure, BUN, and creatinine can tolerate  Acute on chronic anemia, mild thrombocytopenia -no evidence of bleeding.  Hemoglobin down to 6.8 received yet another unit of blood this morning, this was her second unit since admission  plan Hemoccult stool Changed to SCDs Trend CBC Interestingly she has not been on anticoagulation for her atrial fibrillation in the past, back in 2016 there is documentation of previous bleeding, not clear if this is GI or what source was Change H2B to PPI BID   Hypothyroidism Plan Continue Synthroid via tube   Toxic metabolic encephalopathy, need for sedating medications for mechanical ventilation Plan Continuing Precedex and as needed fentanyl, RASS goal -1  Continue Zoloft   Difficult IV access Plan PICC line placed   Best practice:  Diet: Initiate tube feeds today on 1/4 Pain/Anxiety/Delirium protocol (if indicated): Intermittent Versed, fentanyl VAP  protocol (if indicated): Ordered DVT prophylaxis: Enoxaparin GI prophylaxis: Pepcid Glucose control: ICU protocol SSI Mobility: BR Code Status: Full Family Communication: Spoke with the patient's sister on 1/4. She would like for [place] to also the son [phone] Lovene Fishell, which I did on 1/4 Disposition: ICU   Critical care time: 45  minutes.      Erick Colace ACNP-BC [place] Pager # [phone] OR # [phone] if no answer

## 2019-01-21 NOTE — Progress Notes (Signed)
Continues having agitation and restlessness. Throws hands up in the air. Soft bilateral hand mitts are in use, due to putting hands up to mouth in what appears to be attempts to pull at tubing. PRN medications utilized per order for agitation/fighting ventilator/restlessness. See EMAR. IV Precedex infusing at 1.2 mcg/kg/hr. She opens eyes spontaneously w/out stimuli and attempts to try to talk and throws arms up in air or slaps side rail w/ hand to get attention. When asked if she is uncomfortable she nods head in a yes response. Frequent repositioning and mouthcare performed for comfort. Continuing to monitor at bedside.

## 2019-01-21 NOTE — Progress Notes (Signed)
OT Cancellation Note  Patient Details Name: ADYSEN SEK MRN: [id number] DOB: [birth year]   Cancelled Treatment:    Reason Eval/Treat Not Completed: Medical issues which prohibited therapy;Patient not medically ready(Intubated and sedated. Will return as medically ready and as schedule allows.)  [place], OTR/L Acute Rehab Pager: [phone] Office: [phone] 01/21/2019, 7:43 AM

## 2019-01-22 ENCOUNTER — Inpatient Hospital Stay (HOSPITAL_COMMUNITY): Payer: Medicare Other

## 2019-01-22 LAB — TYPE AND SCREEN
ABO/RH(D): A NEG
Antibody Screen: NEGATIVE
Unit division: 0
Unit division: 0

## 2019-01-22 LAB — CBC WITH DIFFERENTIAL/PLATELET
Abs Immature Granulocytes: 0.07 10*3/uL (ref 0.00–0.07)
Basophils Absolute: 0 10*3/uL (ref 0.0–0.1)
Basophils Relative: 0 %
Eosinophils Absolute: 0 10*3/uL (ref 0.0–0.5)
Eosinophils Relative: 0 %
HCT: 33.5 % — ABNORMAL LOW (ref 36.0–46.0)
Hemoglobin: 10.2 g/dL — ABNORMAL LOW (ref 12.0–15.0)
Immature Granulocytes: 1 %
Lymphocytes Relative: 7 %
Lymphs Abs: 0.7 10*3/uL (ref 0.7–4.0)
MCH: 30.7 pg (ref 26.0–34.0)
MCHC: 30.4 g/dL (ref 30.0–36.0)
MCV: 100.9 fL — ABNORMAL HIGH (ref 80.0–100.0)
Monocytes Absolute: 0.5 10*3/uL (ref 0.1–1.0)
Monocytes Relative: 5 %
Neutro Abs: 8.6 10*3/uL — ABNORMAL HIGH (ref 1.7–7.7)
Neutrophils Relative %: 87 %
Platelets: 148 10*3/uL — ABNORMAL LOW (ref 150–400)
RBC: 3.32 MIL/uL — ABNORMAL LOW (ref 3.87–5.11)
RDW: 16.2 % — ABNORMAL HIGH (ref 11.5–15.5)
WBC: 9.8 10*3/uL (ref 4.0–10.5)
nRBC: 0 % (ref 0.0–0.2)

## 2019-01-22 LAB — POCT I-STAT 7, (LYTES, BLD GAS, ICA,H+H)
Calcium, Ion: 1.27 mmol/L (ref 1.15–1.40)
HCT: 36 % (ref 36.0–46.0)
Hemoglobin: 12.2 g/dL (ref 12.0–15.0)
Patient temperature: 98
Potassium: 4.9 mmol/L (ref 3.5–5.1)
Sodium: 144 mmol/L (ref 135–145)
pCO2 arterial: >97 mmHg (ref 32.0–48.0)
pH, Arterial: 7.111 — CL (ref 7.350–7.450)
pO2, Arterial: 90 mmHg (ref 83.0–108.0)

## 2019-01-22 LAB — COMPREHENSIVE METABOLIC PANEL
ALT: 39 U/L (ref 0–44)
AST: 18 U/L (ref 15–41)
Albumin: 3.2 g/dL — ABNORMAL LOW (ref 3.5–5.0)
Alkaline Phosphatase: 61 U/L (ref 38–126)
Anion gap: 8 (ref 5–15)
BUN: 87 mg/dL — ABNORMAL HIGH (ref 8–23)
CO2: 34 mmol/L — ABNORMAL HIGH (ref 22–32)
Calcium: 8.5 mg/dL — ABNORMAL LOW (ref 8.9–10.3)
Chloride: 103 mmol/L (ref 98–111)
Creatinine, Ser: 1.02 mg/dL — ABNORMAL HIGH (ref 0.44–1.00)
GFR calc Af Amer: 58 mL/min — ABNORMAL LOW (ref >60)
GFR calc non Af Amer: 50 mL/min — ABNORMAL LOW (ref >60)
Glucose, Bld: 219 mg/dL — ABNORMAL HIGH (ref 70–99)
Potassium: 4.6 mmol/L (ref 3.5–5.1)
Sodium: 145 mmol/L (ref 135–145)
Total Bilirubin: 0.9 mg/dL (ref 0.3–1.2)
Total Protein: 5.7 g/dL — ABNORMAL LOW (ref 6.5–8.1)

## 2019-01-22 LAB — BPAM RBC
Blood Product Expiration Date: 202101082359
Blood Product Expiration Date: 202101172359
ISSUE DATE / TIME: 202101021822
ISSUE DATE / TIME: 202101050820
Unit Type and Rh: 600
Unit Type and Rh: 600

## 2019-01-22 LAB — GLUCOSE, CAPILLARY
Glucose-Capillary: 108 mg/dL — ABNORMAL HIGH (ref 70–99)
Glucose-Capillary: 120 mg/dL — ABNORMAL HIGH (ref 70–99)
Glucose-Capillary: 146 mg/dL — ABNORMAL HIGH (ref 70–99)
Glucose-Capillary: 205 mg/dL — ABNORMAL HIGH (ref 70–99)

## 2019-01-22 LAB — FERRITIN: Ferritin: 388 ng/mL — ABNORMAL HIGH (ref 11–307)

## 2019-01-22 LAB — TRIGLYCERIDES: Triglycerides: 72 mg/dL (ref <150)

## 2019-01-22 LAB — C-REACTIVE PROTEIN: CRP: 2.8 mg/dL — ABNORMAL HIGH (ref <1.0)

## 2019-01-22 MED ORDER — NOREPINEPHRINE 4 MG/250ML-% IV SOLN
INTRAVENOUS | Status: AC
  Filled 2019-01-22: qty 250

## 2019-01-22 MED ORDER — MORPHINE SULFATE (PF) 2 MG/ML IV SOLN
1.0000 mg | INTRAVENOUS | Status: DC | PRN
  Administered 2019-01-23: 1 mg via INTRAVENOUS
  Administered 2019-01-23 (×2): 0.5 mg via INTRAVENOUS
  Administered 2019-01-23 (×2): 1 mg via INTRAVENOUS
  Filled 2019-01-22 (×3): qty 1

## 2019-01-22 MED ORDER — ORAL CARE MOUTH RINSE
15.0000 mL | Freq: Two times a day (BID) | OROMUCOSAL | Status: DC
  Administered 2019-01-22 – 2019-01-23 (×3): 15 mL via OROMUCOSAL

## 2019-01-22 NOTE — Progress Notes (Signed)
Patient's sister Garnetta Buddy was called and updated about patient's status.  Hassan Rowan questioned whether the patient had been switched to a DNR and stated that she just wants the patient to be comfortable.  Hassan Rowan stated that she would like to set up a time tomorrow to speak to [facility] on the phone as she does not have access to video.  Will pass this along to the day shift nurse.

## 2019-01-22 NOTE — Progress Notes (Signed)
PT Cancellation Note  Patient Details Name: Kaylee Glover MRN: [id number] DOB: [birth year]   Cancelled Treatment:    Reason Eval/Treat Not Completed: Medical issues which prohibited therapy.  See OT note this date for details. PT to follow.  Thanks,  Verdene Lennert, PT, DPT  Acute Rehabilitation [phone] pager [phone] office  @ [email or web address]: [phone]     Harvie Heck 01/22/2019, 4:04 PM

## 2019-01-22 NOTE — Progress Notes (Signed)
Britta Mccreedy RN and I spoke with [name] [phone] on the phone about making changes to patient code status and for comfort measures to be taken in regards with patients mother. Notified MD of his wishes.

## 2019-01-22 NOTE — Progress Notes (Signed)
eLink Physician-Brief Progress Note Patient Name: Kaylee Glover DOB: [birth year] MRN: [id number]   Date of Service  01/22/2019  HPI/Events of Note  Increased WOB and altered mentation.   eICU Interventions  Will order: 1. ABG STAT. 2. Will have bedside nurse call hospitalist to evaluate the patient for intubation.         Swan Fairfax Cornelia Copa 01/22/2019, 3:38 AM

## 2019-01-22 NOTE — Progress Notes (Signed)
OT Cancellation Note  Patient Details Name: Kaylee Glover MRN: [id number] DOB: 12-Jun-1932   Cancelled Treatment:    Reason Eval/Treat Not Completed: Medical issues which prohibited therapy;Patient not medically ready. Consulted with RN in room. Per RN, pt with increased work of breathing this afternoon as compared to this morning. Pt seated upright in bed, noticeably short of breath and pursed lip breathing. Pt unable to speak due to shortness of breath. OT, PT, and RN in agreement to hold therapy this date. OT will continue to follow acutely.   Mauri [place] 01/22/2019, 3:50 PM

## 2019-01-22 NOTE — Progress Notes (Signed)
NAME:  Kaylee Glover, MRN:  [id number], DOB:  01/16/1933, LOS: 4 ADMISSION DATE:  01/26/2019,  CHIEF COMPLAINT: Acute hypoxic and hypercapnic respiratory failure  Brief History    86 year old woman with a history of atrial fibrillation (not on anticoagulation), mild MS, AS and diastolic CHF.  Also with COPD and chronic hypoxemic respiratory failure on 2 L/min at baseline.  She has had 4 to 5 days of viral symptoms, initially negative COVID-19 test as an outpatient.  Progressively worsened and evaluated in [facility] ED.  Evolved hypercapnic and hypoxemic respiratory failure requiring intubation and mechanical ventilation.  Started dexamethasone, remdesivir, bronchodilators.  Chest x-ray shows hyperinflation with bilateral interstitial infiltrates, possible lower lobe atelectasis and effusions.  She was treated empirically with cefepime for possible healthcare associated pneumonia in the ED.  Transferred to [facility] for further care intubated and sedated.   Past Medical History   has a past medical history of Anemia, Asthma, Atrial fibrillation ([place]), Cancer ([place]), Chronic respiratory failure ([place]), Congestive heart failure (CHF) ([place]), COPD (chronic obstructive pulmonary disease) ([place]), Hypertension, Hypothyroidism, Non-Hodgkin lymphoma ([place]), Osteoarthritis, Parkinson disease ([place]), Pneumonia, and Valvular heart disease.   Significant Hospital Events   Intubated at [facility] Transferred to [facility] 1/2, intubated 1/3: Extubated 1/4: Worsening work of breathing, progressive hypoxia, progressive somnolence, eventually agonal respiratory efforts reintubated by CRNA 1/5: Hemoglobin down to 6.8, no obvious bleeding, getting another unit of blood, this reflects her second transfusion since admission.  Heparin stopped, PPI started twice daily.  Inflammatory markers improving.  Extubated after passing spontaneous breathing trial 1/6: Was tenuous post extubation with labored respiratory efforts.  Once again  developed worsening respiratory failure and work of breathing over the course of the evening.  Family contacted, DO NOT RESUSCITATE established Consults:  PCCM  Procedures:  oett 1/2>>>1/3 >>1/4>>1/5  Significant Diagnostic Tests:    Micro Data:  SARS CoV2 1/1 [name] >> positive Blood cultures 1/1 [name] >>  Respiratory culture 1/2 >>  Urine culture 1/2 >>   Antimicrobials:  Remdesivir 1/1 >> Cefepime 1/1 >> 1/2  Interim history/subjective:   Worsening work of breathing early this morning once again Objective   Blood pressure 117/68, pulse 60, temperature 98 F (36.7 C), temperature source Axillary, resp. rate 12, height 5' 2.99" (1.6 m), weight 34.9 kg, SpO2 100 %.        Intake/Output Summary (Last 24 hours) at 01/22/2019 0933 Last data filed at 01/22/2019 0400 Gross per 24 hour  Intake 372.92 ml  Output 1700 ml  Net -1327.08 ml   Filed Weights   01/22/2019 1907 01/21/19 0412  Weight: 34.9 kg 34.9 kg    Examination:  General this is a frail 86 year old wf she remains very weak post extubation HENT NCAT. No JVD Pulm decreased throughout worsening accessory use  Card bradycardic systolic murmur  Ext brisk CR chronic LE edema  abd soft not tender + bowel  Neuro less awake very weak. Not able to lift arms above gravity gu clear yellow.    Resolved Hospital Problem list   Hypermagnesemia and hyper phosphatemia  Assessment & Plan:   Acute on chronic hypoxemic and hypercapnic respiratory failure due to COVID-19 pneumonia, possible superimposed acute on chronic diastolic CHF and COPD Baseline oxygen need 2 L/min Failed extubation, reintubated 1/4 and again extubated on 1/5 with tenuous postextubation exam, I continue to think her respiratory failure is multifactorial in nature, Covid infection, exacerbating heart failure, and superimposed on underlying severe lung disease, weight loss and malnutrition  and overall failure to thrive.  She was made a DO NOT  RESUSCITATE early this morning Plan Continue supplemental oxygen, with pulse oximetry  continue IV Lasix Continue scheduled bronchodilators Day #4 of 5 remdesivir Day #4 of 10 steroids DO NOT RESUSCITATE/DO NOT INTUBATE  Atrial fibrillation with acute on chronic diastolic CHF Not on anticoagulation due to prior GI bleeding Plan Added back digoxin and calcium channel blocker at half of her home dosing  Continue telemetry monitoring Hold CCB for bradycardia  fluid and electrolyte imbalance and mild AKI: Creatinine continues to improve, I&O balance improved with -1.9 now Plan We will continue to aggressively push IV Lasix as BP, BUN, and creatinine tolerate  Acute on chronic anemia, mild thrombocytopenia No evidence of active bleeding, hemoglobin was down to 6.8, now 10.2 after last unit of blood I suspect some of this is hemoconcentration. plan Hemoccult stool ordered  Heparin/low molecular weight heparin discontinued and placed on SCDs  Continue to trend CBC  Transfuse for hemoglobin less than 7  PPI twice daily    Hypothyroidism Plan Continue Synthroid  Toxic metabolic encephalopathy, need for sedating medications for mechanical ventilation Plan Continue Zoloft  Difficult IV access Plan picc placed   Best practice:  Diet: Initiate tube feeds today on 1/4 Pain/Anxiety/Delirium protocol (if indicated): Intermittent Versed, fentanyl VAP protocol (if indicated): Ordered DVT prophylaxis: Enoxaparin GI prophylaxis: Pepcid Glucose control: ICU protocol SSI Mobility: BR Code Status: DNR Family Communication: Spoke with the patient's sister on 1/4. She would like for [place] to also the son [phone] Kaylee Glover, which I did on 1/4 Disposition: ICU   Critical care time: 32 minutes.,      Erick Colace ACNP-BC [place] Pager # [phone] OR # [phone] if no answer

## 2019-01-23 DIAGNOSIS — I4811 Longstanding persistent atrial fibrillation: Secondary | ICD-10-CM

## 2019-01-23 DIAGNOSIS — R0602 Shortness of breath: Secondary | ICD-10-CM

## 2019-01-23 DIAGNOSIS — Z515 Encounter for palliative care: Secondary | ICD-10-CM

## 2019-01-23 LAB — COMPREHENSIVE METABOLIC PANEL
ALT: 35 U/L (ref 0–44)
AST: 13 U/L — ABNORMAL LOW (ref 15–41)
Albumin: 3.3 g/dL — ABNORMAL LOW (ref 3.5–5.0)
Alkaline Phosphatase: 54 U/L (ref 38–126)
Anion gap: 9 (ref 5–15)
BUN: 92 mg/dL — ABNORMAL HIGH (ref 8–23)
CO2: 37 mmol/L — ABNORMAL HIGH (ref 22–32)
Calcium: 8.5 mg/dL — ABNORMAL LOW (ref 8.9–10.3)
Chloride: 97 mmol/L — ABNORMAL LOW (ref 98–111)
Creatinine, Ser: 0.92 mg/dL (ref 0.44–1.00)
GFR calc Af Amer: >60 mL/min (ref >60)
GFR calc non Af Amer: 57 mL/min — ABNORMAL LOW (ref >60)
Glucose, Bld: 214 mg/dL — ABNORMAL HIGH (ref 70–99)
Potassium: 4.1 mmol/L (ref 3.5–5.1)
Sodium: 143 mmol/L (ref 135–145)
Total Bilirubin: 0.8 mg/dL (ref 0.3–1.2)
Total Protein: 5.7 g/dL — ABNORMAL LOW (ref 6.5–8.1)

## 2019-01-23 LAB — CBC WITH DIFFERENTIAL/PLATELET
Abs Immature Granulocytes: 0.04 10*3/uL (ref 0.00–0.07)
Basophils Absolute: 0 10*3/uL (ref 0.0–0.1)
Basophils Relative: 0 %
Eosinophils Absolute: 0 10*3/uL (ref 0.0–0.5)
Eosinophils Relative: 0 %
HCT: 34.5 % — ABNORMAL LOW (ref 36.0–46.0)
Hemoglobin: 10.3 g/dL — ABNORMAL LOW (ref 12.0–15.0)
Immature Granulocytes: 0 %
Lymphocytes Relative: 6 %
Lymphs Abs: 0.5 10*3/uL — ABNORMAL LOW (ref 0.7–4.0)
MCH: 31.5 pg (ref 26.0–34.0)
MCHC: 29.9 g/dL — ABNORMAL LOW (ref 30.0–36.0)
MCV: 105.5 fL — ABNORMAL HIGH (ref 80.0–100.0)
Monocytes Absolute: 0.3 10*3/uL (ref 0.1–1.0)
Monocytes Relative: 4 %
Neutro Abs: 8.1 10*3/uL — ABNORMAL HIGH (ref 1.7–7.7)
Neutrophils Relative %: 90 %
Platelets: 106 10*3/uL — ABNORMAL LOW (ref 150–400)
RBC: 3.27 MIL/uL — ABNORMAL LOW (ref 3.87–5.11)
RDW: 15.7 % — ABNORMAL HIGH (ref 11.5–15.5)
WBC: 9 10*3/uL (ref 4.0–10.5)
nRBC: 0 % (ref 0.0–0.2)

## 2019-01-23 LAB — GLUCOSE, CAPILLARY
Glucose-Capillary: 165 mg/dL — ABNORMAL HIGH (ref 70–99)
Glucose-Capillary: 129 mg/dL — ABNORMAL HIGH (ref 70–99)
Glucose-Capillary: 196 mg/dL — ABNORMAL HIGH (ref 70–99)
Glucose-Capillary: 169 mg/dL — ABNORMAL HIGH (ref 70–99)
Glucose-Capillary: 131 mg/dL — ABNORMAL HIGH (ref 70–99)

## 2019-01-23 LAB — C-REACTIVE PROTEIN: CRP: 2.1 mg/dL — ABNORMAL HIGH (ref <1.0)

## 2019-01-23 LAB — FERRITIN: Ferritin: 268 ng/mL (ref 11–307)

## 2019-02-17 NOTE — Progress Notes (Signed)
Dr Lake Bells notified of time of death, verified with x2 RN. Attempted to call family, no answer at phone number provided.

## 2019-02-17 NOTE — Progress Notes (Signed)
Attempted one more time to contact Kaylee Glover at number provided. Still no answer. [phone]

## 2019-02-17 NOTE — Progress Notes (Signed)
NAME:  Kaylee Glover, MRN:  [id number], DOB:  1933-01-06, LOS: 5 ADMISSION DATE:  02/14/2019,  CHIEF COMPLAINT: Acute hypoxic and hypercapnic respiratory failure  Brief History    86 year old woman with a history of atrial fibrillation (not on anticoagulation), mild MS, AS and diastolic CHF.  Also with COPD and chronic hypoxemic respiratory failure on 2 L/min at baseline.  She has had 4 to 5 days of viral symptoms, initially negative COVID-19 test as an outpatient.  Progressively worsened and evaluated in [facility] ED.  Evolved hypercapnic and hypoxemic respiratory failure requiring intubation and mechanical ventilation.  Started dexamethasone, remdesivir, bronchodilators.  Chest x-ray shows hyperinflation with bilateral interstitial infiltrates, possible lower lobe atelectasis and effusions.  She was treated empirically with cefepime for possible healthcare associated pneumonia in the ED.  Transferred to [facility] for further care intubated and sedated.   Past Medical History   has a past medical history of Anemia, Asthma, Atrial fibrillation (Kaylee Glover), Cancer (Kaylee Glover), Chronic respiratory failure (Kaylee Glover), Congestive heart failure (CHF) (Kaylee Glover), COPD (chronic obstructive pulmonary disease) (Kaylee Glover), Hypertension, Hypothyroidism, Non-Hodgkin lymphoma (Kaylee Glover), Osteoarthritis, Parkinson disease (Kaylee Glover), Pneumonia, and Valvular heart disease.   Significant Glover Events   Intubated at [facility] Transferred to Kaylee Glover 1/2, intubated 1/3: Extubated 1/4: Worsening work of breathing, progressive hypoxia, progressive somnolence, eventually agonal respiratory efforts reintubated by CRNA 1/5: Hemoglobin down to 6.8, no obvious bleeding, getting another unit of blood, this reflects her second transfusion since admission.  Heparin stopped, PPI started twice daily.  Inflammatory markers improving.  Extubated after passing spontaneous breathing trial 1/6: Was tenuous post extubation with labored respiratory efforts.  Once again  developed worsening respiratory failure and work of breathing over the course of the evening.  Family contacted, DO NOT RESUSCITATE established 1/7 endorses she is short of breath and struggling. Morphine encouraged Consults:  PCCM  Procedures:  oett 1/2>>>1/3 >>1/4>>1/5  Significant Diagnostic Tests:    Micro Data:  SARS CoV2 1/1 Kaylee Glover) >> positive Blood cultures 1/1 Kaylee Glover) >>  Respiratory culture 1/2 >>  Urine culture 1/2 >>   Antimicrobials:  Remdesivir 1/1 >> Cefepime 1/1 >> 1/2  Interim history/subjective:   Needing morphine for dyspnea now Objective   Blood pressure (Abnormal) 121/52, pulse 92, temperature 97.7 F (36.5 C), temperature source Axillary, resp. rate 10, height 5' 2.99" (1.6 m), weight 51.4 kg, SpO2 100 %.        Intake/Output Summary (Last 24 hours) at 01-26-19 0913 Last data filed at 2019-01-26 0600 Gross per 24 hour  Intake 480 ml  Output 1950 ml  Net -1470 ml   Filed Weights   02/07/2019 1907 01/21/19 0412 01-26-19 0500  Weight: 34.9 kg 34.9 kg 51.4 kg    Examination: Genera;l terminally ill appearing 86 year old female having increased WOB  HENT NCAT no JVD MMM NGT in place pulm  Decreased t/o w/ marked accessory use  Card Regular irreg  abd not tender + bowel sounds Ext warm and dry brisk cr has scattered areas of ecchymosis and dependent edema Neuro more withdrawn. Moves ext orientation not clear today  gu cnc yellow    Resolved Glover Problem list   Hypermagnesemia and hyper phosphatemia  Assessment & Plan:   Acute on chronic hypoxemic and hypercapnic respiratory failure due to COVID-19 pneumonia, possible superimposed acute on chronic diastolic CHF and COPD Baseline oxygen need 2 L/min Failed extubation, reintubated 1/4 and again extubated on 1/5 with tenuous postextubation exam, I continue to think her respiratory failure is multifactorial in  nature, Covid infection, exacerbating heart failure, and superimposed on  underlying severe lung disease, weight loss and malnutrition and overall failure to thrive.  She was made a DO NOT RESUSCITATE on 1/6.   Do not expect her to survive given her degree of deconditioning  plan Supplemental oxygen  IV Lasix  Scheduled bronchodilators  Day #5 of 5 remdesivir  Stopping steroids DO NOT RESUSCITATE  DO NOT INTUBATE  Not a candidate for BiPAP  Morphine as needed for respiratory distress  Transfer to floor    Atrial fibrillation with acute on chronic diastolic CHF Not on anticoagulation due to prior GI bleeding Plan Dc  Tele   fluid and electrolyte imbalance and mild AKI: Creatinine continues to improve, I&O balance improved with -3.3 liters Plan Continue Lasix (more for dyspnea)  Acute on chronic anemia, mild thrombocytopenia Hemoglobin stable since transfusion and stopping anticoagulation she does have history of GI bleeding.   Plan PPI via tube twice daily  Hyperglycemia Plan Stopping ssi  Hypothyroidism Plan Synthroid  Toxic metabolic encephalopathy, need for sedating medications for mechanical ventilation Plan Zoloft via tube  Difficult IV access Plan PICC placed  Best practice:  Diet: Initiate tube feeds today on 1/4 Pain/Anxiety/Delirium protocol (if indicated): Intermittent Versed, fentanyl VAP protocol (if indicated): Ordered DVT prophylaxis: Enoxaparin GI prophylaxis: Pepcid Glucose control: ICU protocol SSI Mobility: BR Code Status: DNR Family Communication: Spoke with the patient's sister on 1/4. She would like for [place] to also the son [phone] Kaylee Glover, which I did on 1/4 Disposition: to floor comfort care  Kaylee Glover ACNP-BC [facility] Pager # [phone] OR # [phone] if no answer

## 2019-02-17 NOTE — Death Summary Note (Signed)
DEATH SUMMARY   Patient Details  Name: Kaylee Glover MRN: [id number] DOB: [birth year]  Admission/Discharge Information   Admit Date:  01/17/2019  Date of Death: Date of Death: 2019/01/30  Time of Death: Time of Death: 45  Length of Stay: 5  Referring Physician: Raina Mina., MD   Reason(s) for Hospitalization  COVID 2022/04/11  Diagnoses  Preliminary cause of death:   COVID 04/11/22 Secondary Diagnoses (including complications and co-morbidities):  Active Problems:   Acute respiratory failure (HCC)   Acute diastolic (congestive) heart failure (HCC)   Pressure injury of skin   Pleural effusion   Shortness of breath   Encounter for end of life care   Longstanding persistent atrial fibrillation [facility]   Brief Hospital Course (including significant findings, care, treatment, and services provided and events leading to death)  86 year old woman with a history of atrial fibrillation (not on anticoagulation), mild MS, AS and diastolic CHF.  Also with COPD and chronic hypoxemic respiratory failure on 2 L/min at baseline.  She has had 4 to 5 days of viral symptoms, initially negative COVID-19 test as an outpatient.  Progressively worsened and evaluated in [facility] ED.  Evolved hypercapnic and hypoxemic respiratory failure requiring intubation and mechanical ventilation.  Started dexamethasone, remdesivir, bronchodilators.  Chest x-ray shows hyperinflation with bilateral interstitial infiltrates, possible lower lobe atelectasis and effusions.  She was treated empirically with cefepime for possible healthcare associated pneumonia in the ED.  Transferred to [facility] for further care intubated and sedated.   Signfiicant hospital events: Intubated at [facility] 1/1 Transferred to [facility] 1/2, intubated 1/3: Extubated 1/4: Worsening work of breathing, progressive hypoxia, progressive somnolence, eventually agonal respiratory efforts reintubated by CRNA 1/5: Hemoglobin down to 6.8, no obvious bleeding, getting another  unit of blood, this reflects her second transfusion since admission.  Heparin stopped, PPI started twice daily.  Inflammatory markers improving.  Extubated after passing spontaneous breathing trial 1/6: Was tenuous post extubation with labored respiratory efforts.  Once again developed worsening respiratory failure and work of breathing over the course of the evening.  Family contacted, DO NOT RESUSCITATE established 2022/01/29 endorses she is short of breath and struggling. Morphine encouraged  Comfort care initiated on 01/29/2022 and she passed away peacefully.     Pertinent Labs and Studies  Significant Diagnostic Studies DG Abd 1 View  Result Date: 01/21/2019 CLINICAL DATA:  NG tube placement. EXAM: ABDOMEN - 1 VIEW COMPARISON:  Chest x-ray 01/20/2019. FINDINGS: Distal portion NG tube noted over the stomach in good anatomic position. No gastric distention. PICC line noted over SVC. Cardiomegaly with bilateral pulmonary infiltrates/edema and probable bilateral pleural effusions. Findings suggest CHF. IMPRESSION: 1.  NG tube appears to be in good anatomic position. 2. Cardiomegaly with bilateral pulmonary infiltrates/edema and probable bilateral pleural effusions. Findings suggest. Similar findings noted on prior exam Electronically Signed   By: Marcello Moores  Register   On: 01/21/2019 12:04   DG Chest Port 1 View  Result Date: 01/22/2019 CLINICAL DATA:  Pleural effusion EXAM: PORTABLE CHEST 1 VIEW COMPARISON:  01/21/2019 FINDINGS: Moderate interstitial/airspace opacities in the lungs bilaterally, progressive. Moderate left and small right layering pleural effusions, progressive. Suspected left lower lobe atelectasis. No pneumothorax. Heart is top-normal in size.  Thoracic aortic atherosclerosis. Enteric tube coursing into the stomach. IMPRESSION: Moderate interstitial/airspace opacities bilaterally, favoring interstitial edema over multifocal pneumonia, progressive. Moderate left and small right layering pleural  effusions, increased. Electronically Signed   By: Julian Hy M.D.   On: 01/22/2019 09:01   DG  Chest Port 1 View  Result Date: 01/21/2019 CLINICAL DATA:  Pleural effusion EXAM: PORTABLE CHEST 1 VIEW COMPARISON:  Yesterday FINDINGS: Endotracheal tube tip just below the clavicular heads. The orogastric tube at least reaches the stomach. Right PICC with tip at the upper SVC. Hazy opacity on both sides from layering pleural fluid and pulmonary opacity. Lung volumes appear overall large. Stable cardiopericardial enlargement. No pneumothorax. IMPRESSION: Stable hardware positioning, airspace disease, and layering pleural effusions. Electronically Signed   By: Monte Fantasia M.D.   On: 01/21/2019 07:16   DG CHEST PORT 1 VIEW  Result Date: 01/20/2019 CLINICAL DATA:  PICC line placement EXAM: PORTABLE CHEST 1 VIEW COMPARISON:  01/20/2019 FINDINGS: The endotracheal tube terminates approximately 5 cm above the carina. The newly placed right-sided PICC line tip terminates in the expected region of the cavoatrial junction, however the tip is difficult to fully appreciate. The enteric tube extends below the left hemidiaphragm. The heart size remains enlarged. Aortic calcifications are noted. Diffuse bilateral hazy airspace opacities and bilateral pleural effusions are noted. There is no pneumothorax. IMPRESSION: 1. Lines and tubes as above. The PICC line tip appears to terminate near the cavoatrial junction, however the tip is difficult to fully appreciate on this exam. 2. Otherwise, no significant short interval change. Electronically Signed   By: Constance Holster M.D.   On: 01/20/2019 16:31   DG Chest Port 1 View  Result Date: 01/20/2019 CLINICAL DATA:  Acute respiratory failure. EXAM: PORTABLE CHEST 1 VIEW COMPARISON:  Most recent radiograph yesterday. FINDINGS: Endotracheal tube 3.6 cm from the carina. Enteric tube tip below the diaphragm not included in the field of view. Bilateral pleural effusions,  equivocal worsening from prior exam. Slight worsening in heterogeneous bilateral perihilar opacities. No visualized pneumothorax. Dense aortic atherosclerosis. Borderline cardiomegaly. Possible minimal subcutaneous emphysema in the right supraclavicular soft tissues. IMPRESSION: 1. Worsening bilateral pleural effusions. Worsening bilateral perihilar opacities that may be edema, pneumonia, or combination there of. 2. Stable support apparatus. Electronically Signed   By: Keith Rake M.D.   On: 01/20/2019 04:40   Portable chest 1 View  Result Date: 01/26/2019 CLINICAL DATA:  Shortness of breath EXAM: PORTABLE CHEST 1 VIEW COMPARISON:  01/17/2019 FINDINGS: Cardiac shadow is stable. Aortic calcifications are seen. Endotracheal tube and nasogastric catheter are noted in satisfactory position. Bilateral patchy infiltrates are seen slightly improved when compared with the prior exam. Small effusions are noted bilaterally. IMPRESSION: Slight improvement in bilateral infiltrates. Tubes and lines as described. Electronically Signed   By: Inez Catalina M.D.   On: 01/26/2019 15:07   [place] EKG SITE RITE  Result Date: 01/20/2019 If Site Rite image not attached, placement could not be confirmed due to current cardiac rhythm.   Microbiology Recent Results (from the past 240 hour(s))  Urine culture     Status: Abnormal   Collection Time: 01/24/2019  3:14 PM   Specimen: Urine, Random  Result Value Ref Range Status   Specimen Description   Final    URINE, RANDOM Performed at [facility] [address] [place], [place] [address]    Special Requests   Final    NONE Performed at [facility], [place] [address] [place], [place] [address]    Culture MULTIPLE SPECIES PRESENT, SUGGEST RECOLLECTION (A)  Final   Report Status 01/20/2019 FINAL  Final  Culture, respiratory (non-expectorated)     Status: None   Collection Time: 02/09/2019  5:40 PM   Specimen: Tracheal Aspirate;  Respiratory  Result Value Ref Range Status   Specimen Description   Final    TRACHEAL ASPIRATE Performed at [facility] [address], [place], [place] [address]    Special Requests   Final    NONE Performed at [facility], [place] [address], [place], [place] [address]    Gram Stain   Final    FEW WBC PRESENT, PREDOMINANTLY PMN NO ORGANISMS SEEN    Culture   Final    RARE Consistent with normal respiratory flora. Performed at [facility], [place] [address], [place], [place] [address]    Report Status 01/21/2019 FINAL  Final  MRSA PCR Screening     Status: None   Collection Time: 01/22/2019  6:39 PM   Specimen: Nasal Mucosa; Nasopharyngeal  Result Value Ref Range Status   MRSA by PCR NEGATIVE NEGATIVE Final    Comment:        The GeneXpert MRSA Assay (FDA approved for NASAL specimens only), is one component of a comprehensive MRSA colonization surveillance program. It is not intended to diagnose MRSA infection nor to guide or monitor treatment for MRSA infections. Performed at [facility], [place] [address] [place], [place] [address]     Lab Basic Metabolic Panel: Recent Labs  Lab 01/20/19 1700 01/21/19 0320 01/21/19 2208 01/22/19 0339 01/22/19 0510 02-21-19 0430  NA  --  144  --  144 145 143  K  --  3.6  --  4.9 4.6 4.1  CL  --  102  --   --  103 97*  CO2  --  31  --   --  34* 37*  GLUCOSE  --  147*  --   --  219* 214*  BUN  --  75*  --   --  87* 92*  CREATININE  --  1.11*  --   --  1.02* 0.92  CALCIUM  --  8.2*  --   --  8.5* 8.5*  MG 2.5* 2.2 2.3  --   --   --   PHOS 3.6 2.8 4.3  --   --   --    Liver Function Tests: Recent Labs  Lab 01/21/19 0320 01/22/19 0510 2019-02-21 0430  AST 24 18 13*  ALT 37 39 35  ALKPHOS 52 61 54  BILITOT 0.7 0.9 0.8  PROT 4.3* 5.7* 5.7*  ALBUMIN 2.7* 3.2* 3.3*   No results for input(s): LIPASE, AMYLASE in the last 168 hours. No results for input(s): AMMONIA  in the last 168 hours. CBC: Recent Labs  Lab 01/21/19 0320 01/21/19 1420 01/22/19 0339 01/22/19 0510 February 21, 2019 0430  WBC 5.5  --   --  9.8 9.0  NEUTROABS 4.4  --   --  8.6* 8.1*  HGB 6.8* 9.8* 12.2 10.2* 10.3*  HCT 21.3* 30.5* 36.0 33.5* 34.5*  MCV 96.4  --   --  100.9* 105.5*  PLT 110*  --   --  148* 106*   Cardiac Enzymes: No results for input(s): CKTOTAL, CKMB, CKMBINDEX, TROPONINI in the last 168 hours. Sepsis Labs: Recent Labs  Lab 01/21/19 0320 01/22/19 0510 02-21-19 0430  WBC 5.5 9.8 9.0    Procedures/Operations   Endotracheal tube  Simonne Maffucci 01/27/2019, 1:21 PM

## 2019-02-17 NOTE — Plan of Care (Signed)
Patient remains on 7L HFNC.  NGT remains in place.  Repositioned q2h and other skin interventions in place, however sacral pressure injury now appears to be a stage 2.  Morphine given one time so far this shift for shortness of breath with good effect.  Problem: Respiratory: Goal: Complications related to the disease process, condition or treatment will be avoided or minimized Outcome: Not Progressing   Problem: Activity: Goal: Ability to tolerate increased activity will improve Outcome: Not Progressing   Problem: Health Behavior/Discharge Planning: Goal: Ability to manage health-related needs will improve Outcome: Not Progressing   Problem: Clinical Measurements: Goal: Ability to maintain clinical measurements within normal limits will improve Outcome: Not Progressing Goal: Will remain free from infection Outcome: Not Progressing Goal: Diagnostic test results will improve Outcome: Not Progressing Goal: Respiratory complications will improve Outcome: Not Progressing   Problem: Activity: Goal: Risk for activity intolerance will decrease Outcome: Not Progressing   Problem: Skin Integrity: Goal: Risk for impaired skin integrity will decrease Outcome: Not Progressing

## 2019-02-17 NOTE — Progress Notes (Signed)
[facility] called. Referral [id number]. Full rule out for donation. Ok to release to funeral service.

## 2019-02-17 DEATH — deceased

## 2020-02-17 DEATH — deceased

## 2020-03-29 IMAGING — DX DG ABDOMEN 1V
1 series · 1 of 1 positions shown · non-contrast
Comparison: Chest x-ray 01/20/2019.

CLINICAL DATA: NG tube placement.

EXAM:
ABDOMEN - 1 VIEW

[abdomen kub]
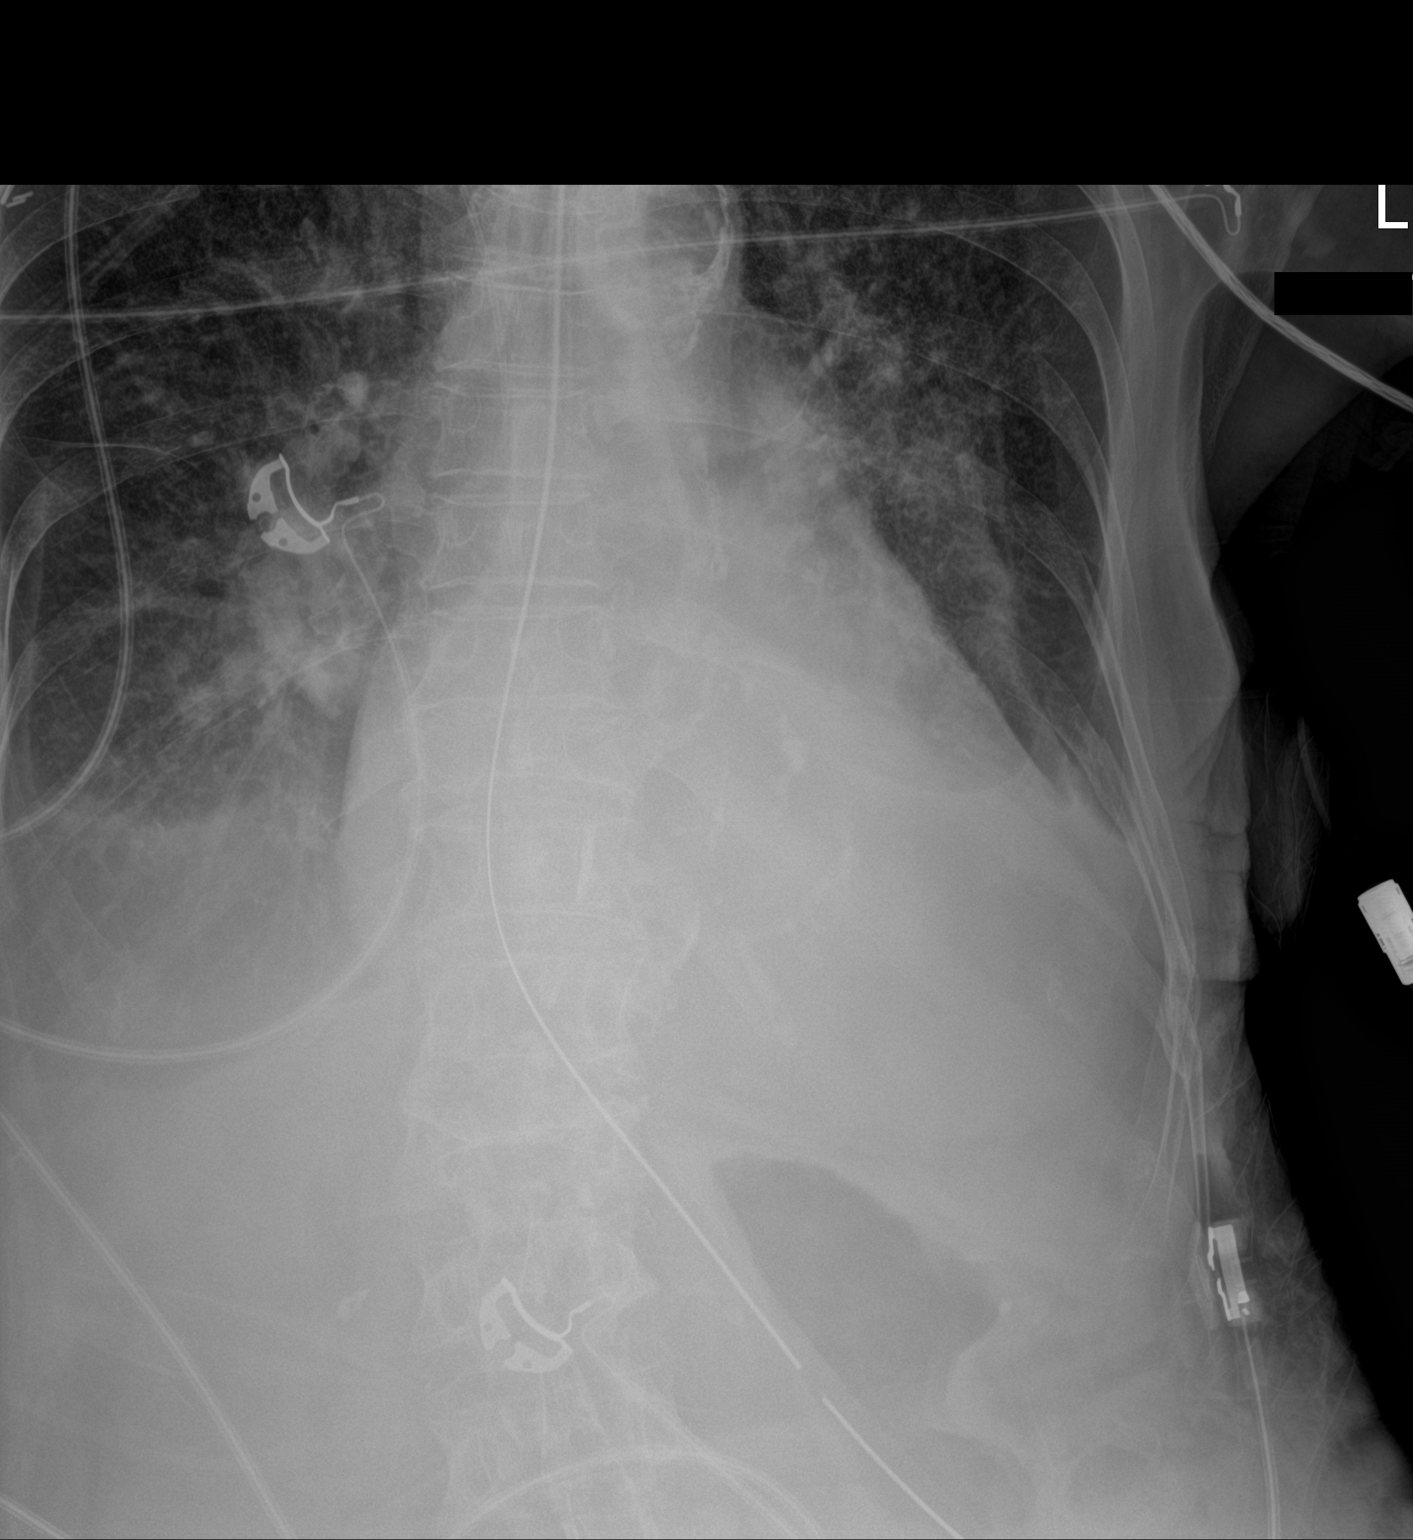

[1 of 1 positions shown; findings below may reference images not displayed]

FINDINGS: Distal portion NG tube noted over the stomach in good anatomic
position. No gastric distention. PICC line noted over SVC.
Cardiomegaly with bilateral pulmonary infiltrates/edema and probable
bilateral pleural effusions. Findings suggest CHF.
IMPRESSION: 1.  NG tube appears to be in good anatomic position.

2. Cardiomegaly with bilateral pulmonary infiltrates/edema and
probable bilateral pleural effusions. Findings suggest. Similar
findings noted on prior exam

## 2020-03-29 IMAGING — DX DG CHEST 1V PORT
1 series · 1 of 1 positions shown · non-contrast
Comparison: Yesterday

CLINICAL DATA: Pleural effusion

EXAM:
PORTABLE CHEST 1 VIEW

[chest ap]
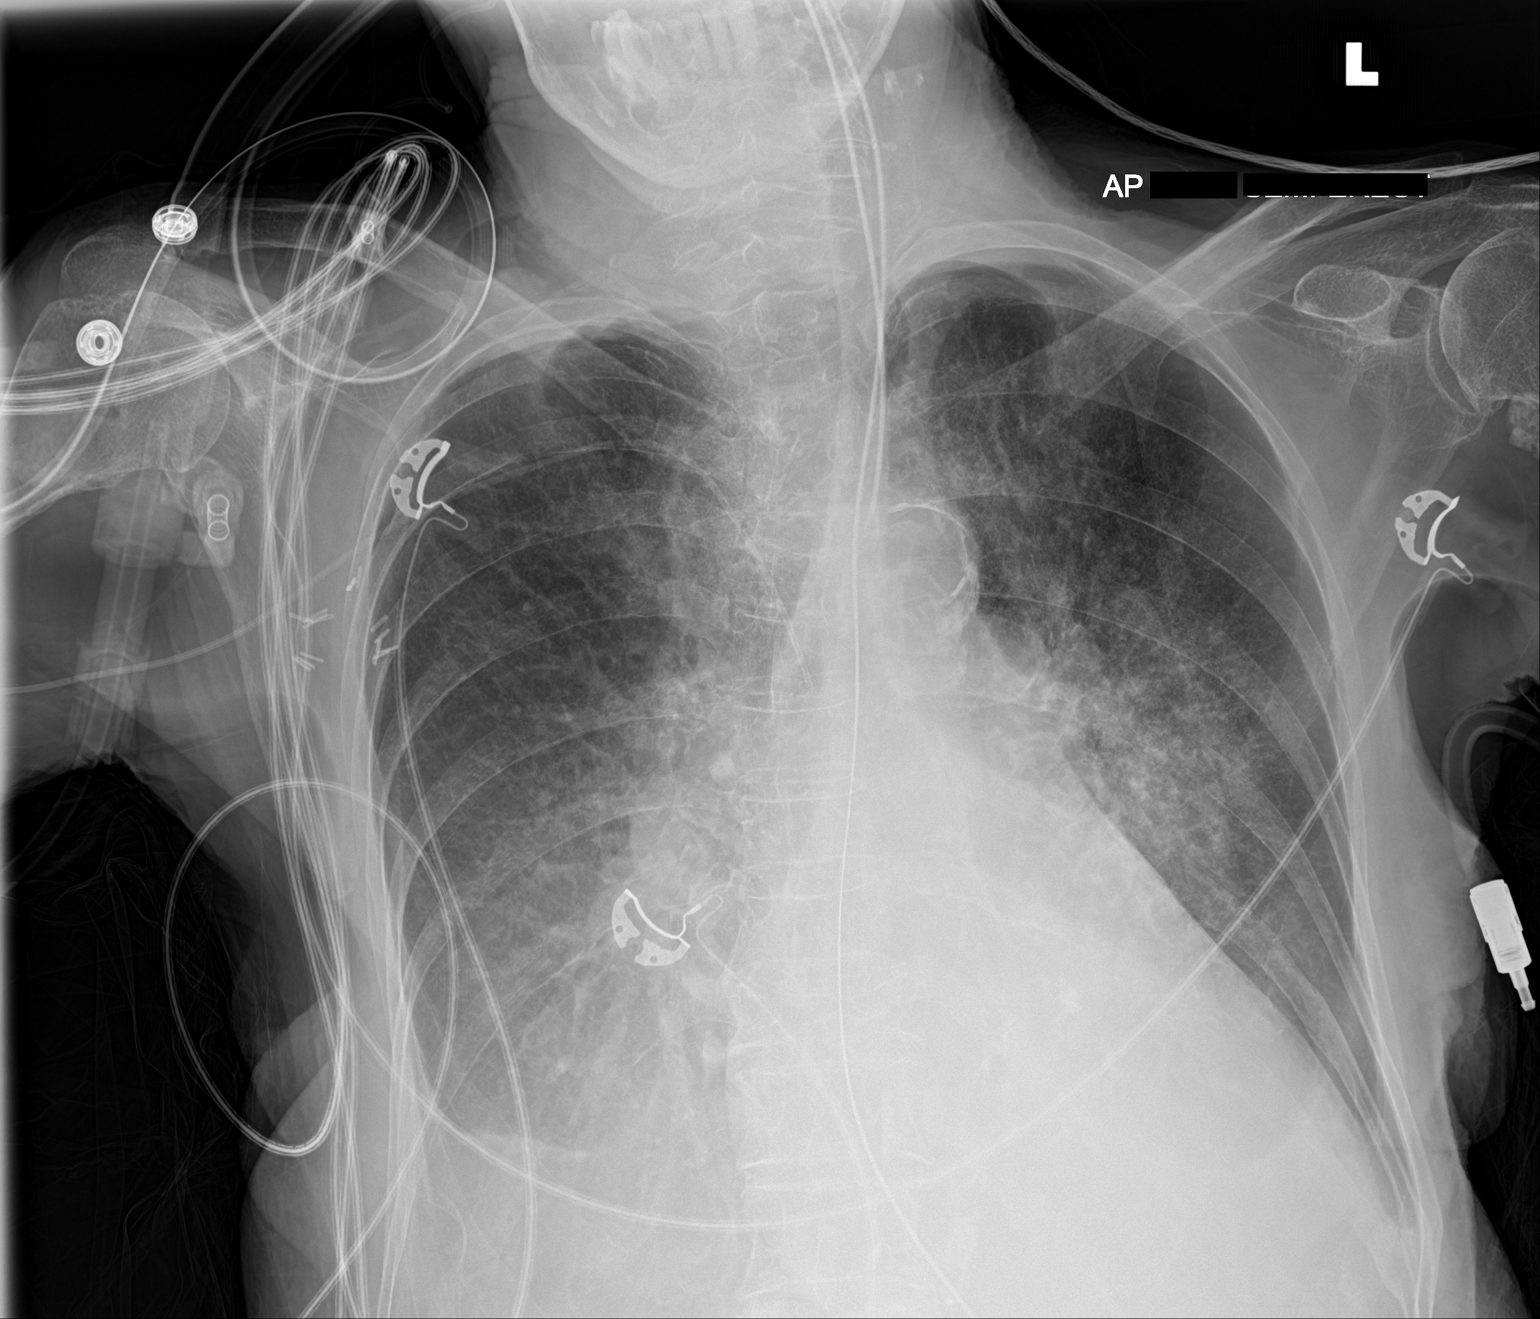

[1 of 1 positions shown; findings below may reference images not displayed]

FINDINGS: Endotracheal tube tip just below the clavicular heads. The
orogastric tube at least reaches the stomach. Right PICC with tip at
the upper SVC.

Hazy opacity on both sides from layering pleural fluid and pulmonary
opacity. Lung volumes appear overall large. Stable cardiopericardial
enlargement. No pneumothorax.
IMPRESSION: Stable hardware positioning, airspace disease, and layering pleural
effusions.
